# Patient Record
Sex: Female | Born: 1985
Health system: Southern US, Community
[De-identification: ages and names within clinical notes are randomized; demographics above are authoritative.]

## PROBLEM LIST (undated history)

## (undated) ENCOUNTER — Inpatient Hospital Stay (HOSPITAL_COMMUNITY): Payer: Self-pay

## (undated) DIAGNOSIS — N883 Incompetence of cervix uteri: Secondary | ICD-10-CM

## (undated) DIAGNOSIS — Z8489 Family history of other specified conditions: Secondary | ICD-10-CM

## (undated) DIAGNOSIS — F3281 Premenstrual dysphoric disorder: Secondary | ICD-10-CM

## (undated) DIAGNOSIS — E063 Autoimmune thyroiditis: Secondary | ICD-10-CM

## (undated) DIAGNOSIS — Z3490 Encounter for supervision of normal pregnancy, unspecified, unspecified trimester: Secondary | ICD-10-CM

## (undated) DIAGNOSIS — F419 Anxiety disorder, unspecified: Secondary | ICD-10-CM

## (undated) DIAGNOSIS — K219 Gastro-esophageal reflux disease without esophagitis: Secondary | ICD-10-CM

## (undated) DIAGNOSIS — R519 Headache, unspecified: Secondary | ICD-10-CM

## (undated) DIAGNOSIS — E282 Polycystic ovarian syndrome: Secondary | ICD-10-CM

## (undated) DIAGNOSIS — E039 Hypothyroidism, unspecified: Secondary | ICD-10-CM

## (undated) HISTORY — DX: Premenstrual dysphoric disorder: F32.81

## (undated) HISTORY — DX: Autoimmune thyroiditis: E06.3

## (undated) HISTORY — DX: Anxiety disorder, unspecified: F41.9

## (undated) HISTORY — DX: Headache, unspecified: R51.9

---
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
## 2004-02-03 ENCOUNTER — Ambulatory Visit (HOSPITAL_COMMUNITY): Admission: RE | Admit: 2004-02-03 | Discharge: 2004-02-03 | Payer: Self-pay | Admitting: Family Medicine

## 2004-02-04 ENCOUNTER — Emergency Department (HOSPITAL_COMMUNITY): Admission: EM | Admit: 2004-02-04 | Discharge: 2004-02-04 | Payer: Self-pay | Admitting: Emergency Medicine

## 2004-02-06 ENCOUNTER — Ambulatory Visit (HOSPITAL_COMMUNITY): Admission: RE | Admit: 2004-02-06 | Discharge: 2004-02-06 | Payer: Self-pay | Admitting: Family Medicine

## 2004-02-13 ENCOUNTER — Encounter: Admission: RE | Admit: 2004-02-13 | Discharge: 2004-02-13 | Payer: Self-pay | Admitting: Gastroenterology

## 2004-02-23 ENCOUNTER — Encounter: Admission: RE | Admit: 2004-02-23 | Discharge: 2004-02-23 | Payer: Self-pay | Admitting: Gastroenterology

## 2004-03-09 ENCOUNTER — Emergency Department (HOSPITAL_COMMUNITY): Admission: EM | Admit: 2004-03-09 | Discharge: 2004-03-09 | Payer: Self-pay | Admitting: Emergency Medicine

## 2004-03-16 ENCOUNTER — Emergency Department (HOSPITAL_COMMUNITY): Admission: EM | Admit: 2004-03-16 | Discharge: 2004-03-16 | Payer: Self-pay | Admitting: Emergency Medicine

## 2004-08-19 ENCOUNTER — Encounter: Admission: RE | Admit: 2004-08-19 | Discharge: 2004-11-17 | Payer: Self-pay | Admitting: Internal Medicine

## 2004-09-06 ENCOUNTER — Emergency Department (HOSPITAL_COMMUNITY): Admission: EM | Admit: 2004-09-06 | Discharge: 2004-09-06 | Payer: Self-pay | Admitting: Emergency Medicine

## 2004-10-25 ENCOUNTER — Ambulatory Visit (HOSPITAL_COMMUNITY): Admission: RE | Admit: 2004-10-25 | Discharge: 2004-10-25 | Payer: Self-pay | Admitting: Endocrinology

## 2005-03-28 ENCOUNTER — Encounter: Admission: RE | Admit: 2005-03-28 | Discharge: 2005-03-28 | Payer: Self-pay | Admitting: Gastroenterology

## 2005-06-08 ENCOUNTER — Emergency Department (HOSPITAL_COMMUNITY): Admission: EM | Admit: 2005-06-08 | Discharge: 2005-06-08 | Payer: Self-pay | Admitting: Emergency Medicine

## 2005-07-31 ENCOUNTER — Ambulatory Visit (HOSPITAL_COMMUNITY): Admission: RE | Admit: 2005-07-31 | Discharge: 2005-07-31 | Payer: Self-pay | Admitting: *Deleted

## 2006-02-17 IMAGING — CR DG BE W/ AIR HIGH DENSITY
8 of 10 series · 8 of 10 positions shown · non-contrast
Comparison: none

CLINICAL DATA: Diarrhea.

DOUBLE-CONTRAST BARIUM ENEMA

[view not recorded (1 of 8)]
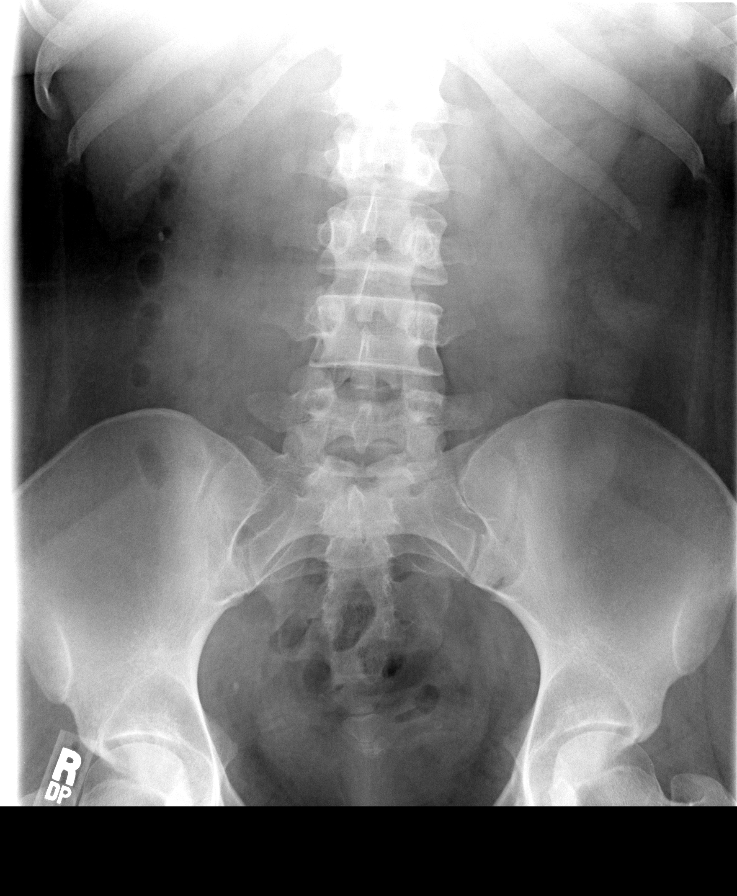

[view not recorded (2 of 8)]
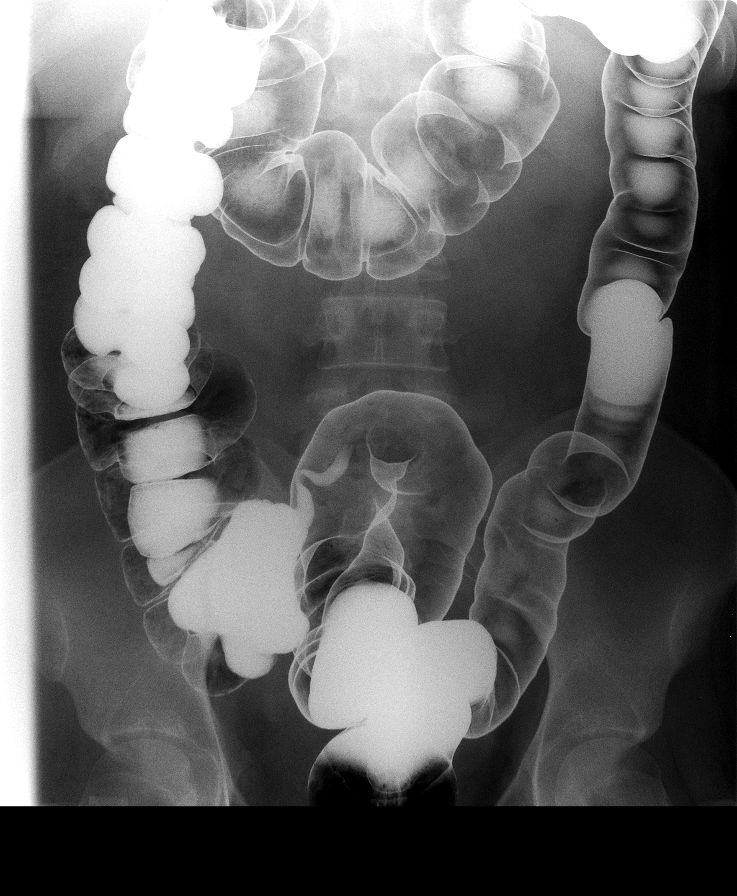

[view not recorded (3 of 8)]
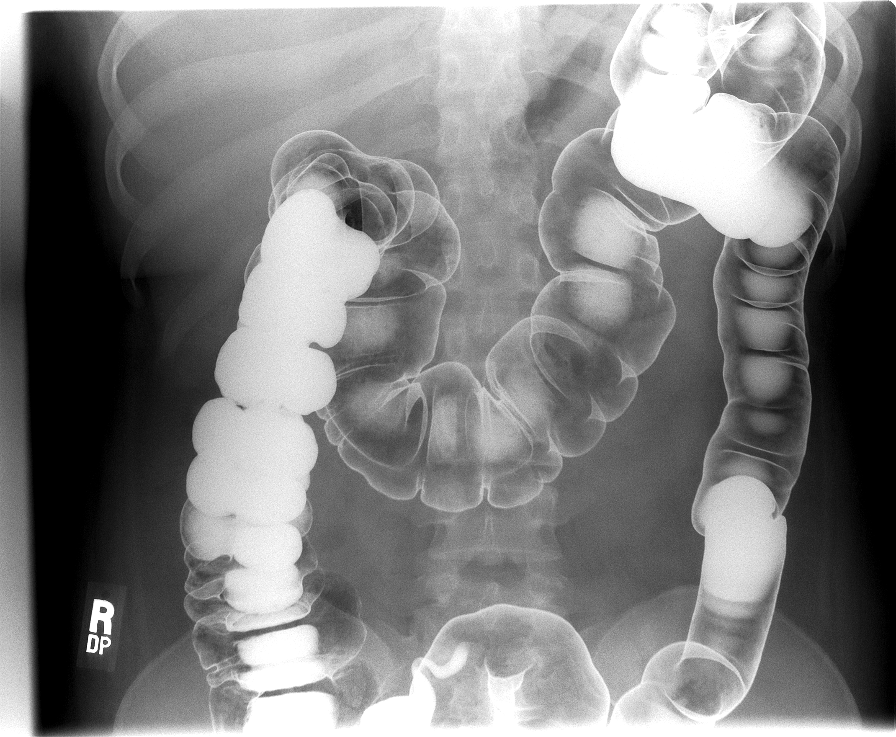

[view not recorded (4 of 8)]
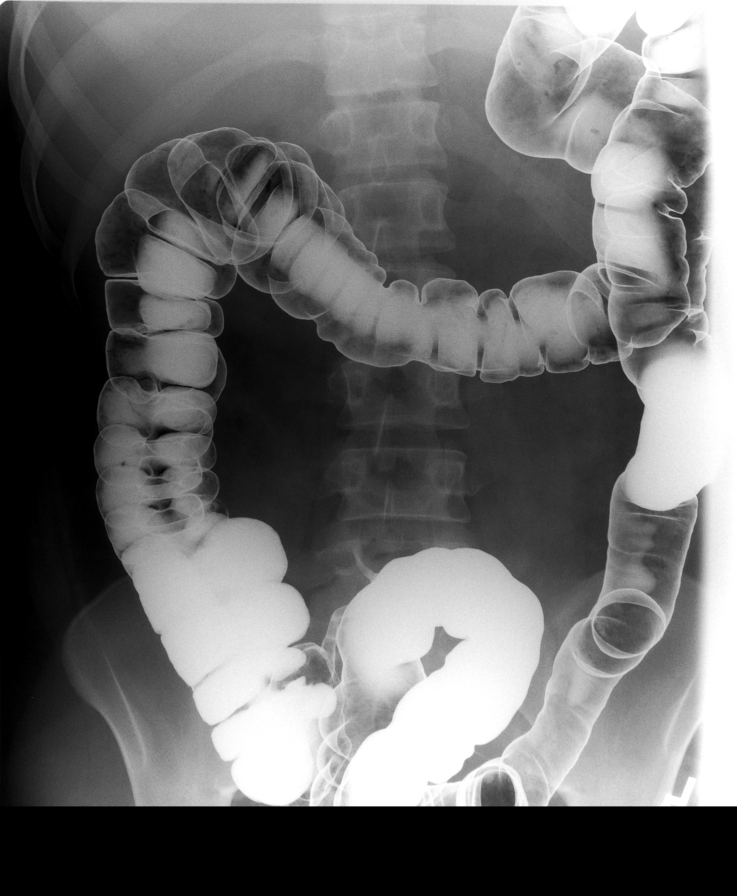

[view not recorded (5 of 8)]
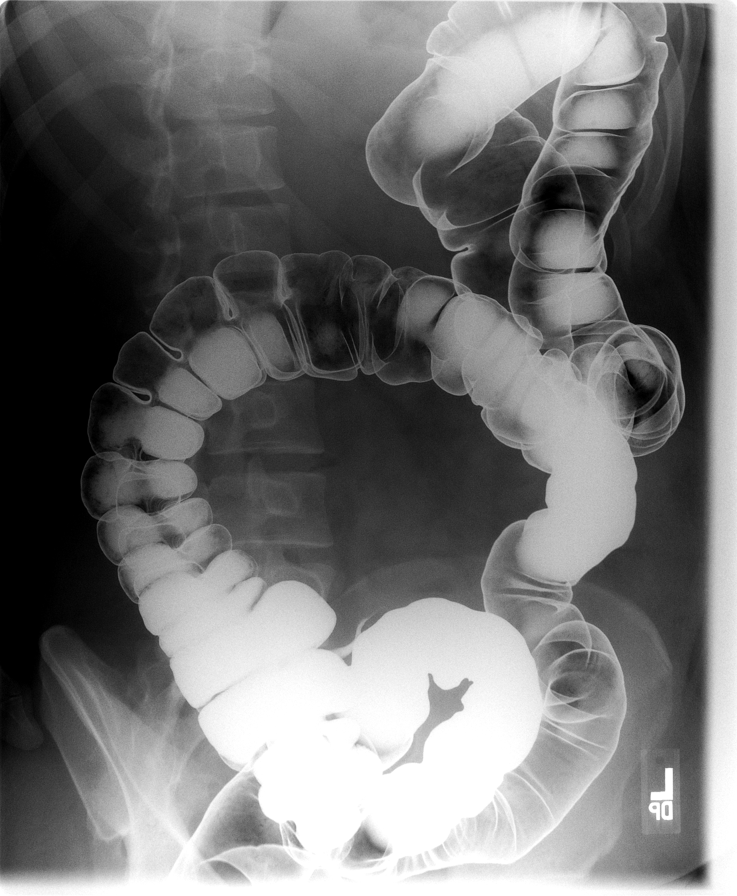

[view not recorded (6 of 8)]
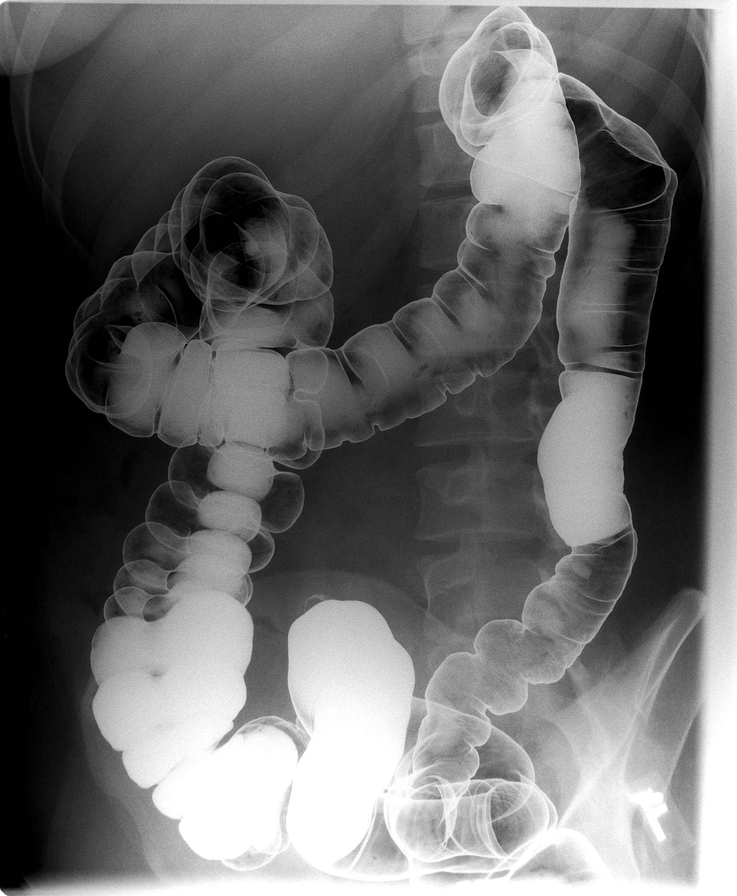

[view not recorded (7 of 8)]
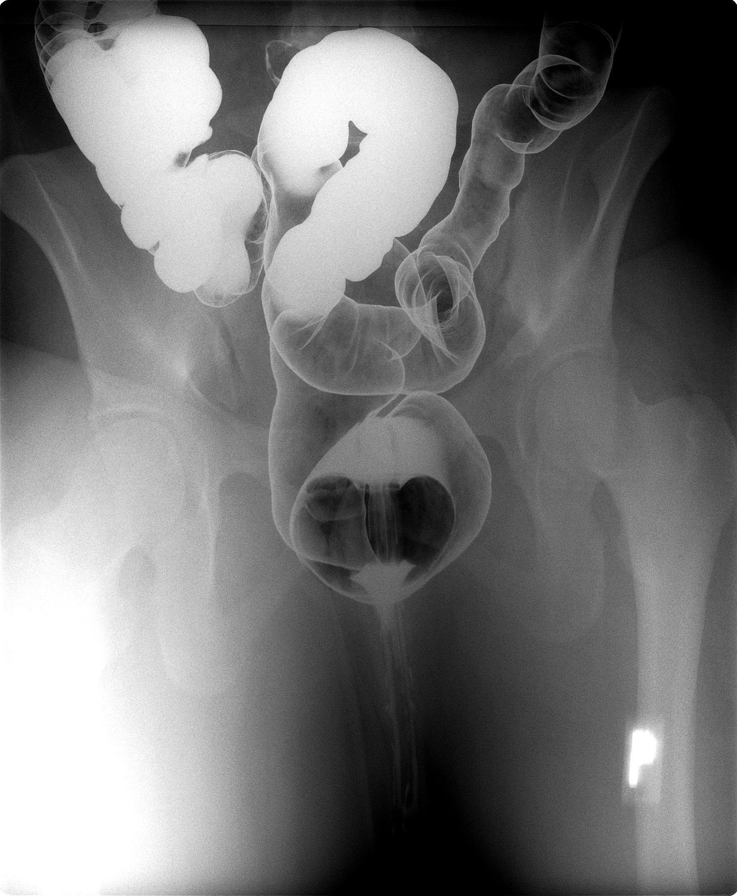

[view not recorded (8 of 8)]
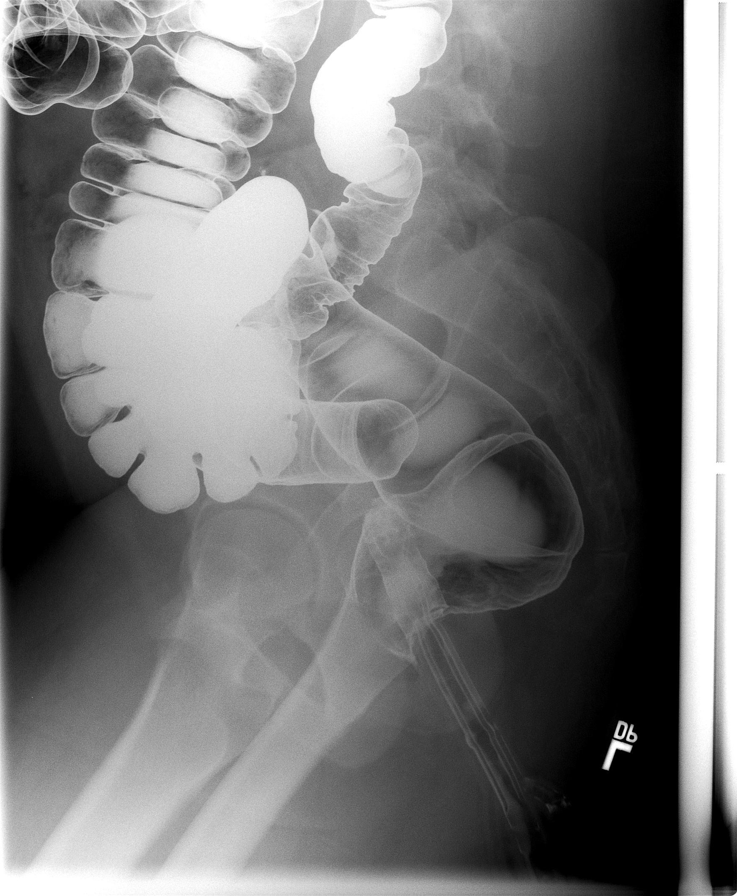

[8 of 10 positions shown; findings below may reference images not displayed]

FINDINGS: Double-contrast barium enema examination was performed with overhead and spot views of
the colon. No intrinsic or extrinsic mass lesions of the colon are identified. No mucosal
abnormalities are seen. The appendix fills and is normal.

IMPRESSION

1. Normal double-contrast barium enema.

## 2006-07-21 ENCOUNTER — Emergency Department (HOSPITAL_COMMUNITY): Admission: EM | Admit: 2006-07-21 | Discharge: 2006-07-21 | Payer: Self-pay | Admitting: Family Medicine

## 2007-03-17 ENCOUNTER — Encounter: Admission: RE | Admit: 2007-03-17 | Discharge: 2007-03-17 | Payer: Self-pay | Admitting: Otolaryngology

## 2007-08-25 ENCOUNTER — Emergency Department (HOSPITAL_COMMUNITY): Admission: EM | Admit: 2007-08-25 | Discharge: 2007-08-25 | Payer: Self-pay | Admitting: Emergency Medicine

## 2009-05-23 ENCOUNTER — Encounter: Admission: RE | Admit: 2009-05-23 | Discharge: 2009-05-30 | Payer: Self-pay | Admitting: Endocrinology

## 2009-11-25 ENCOUNTER — Emergency Department (HOSPITAL_COMMUNITY): Admission: EM | Admit: 2009-11-25 | Discharge: 2009-11-25 | Payer: Self-pay | Admitting: Family Medicine

## 2010-05-16 ENCOUNTER — Ambulatory Visit (HOSPITAL_COMMUNITY)
Admission: RE | Admit: 2010-05-16 | Discharge: 2010-05-16 | Payer: Self-pay | Source: Home / Self Care | Attending: Endocrinology | Admitting: Endocrinology

## 2010-06-23 ENCOUNTER — Encounter: Payer: Self-pay | Admitting: Family Medicine

## 2010-10-08 ENCOUNTER — Inpatient Hospital Stay (INDEPENDENT_AMBULATORY_CARE_PROVIDER_SITE_OTHER)
Admission: RE | Admit: 2010-10-08 | Discharge: 2010-10-08 | Disposition: A | Discharge status: Home / Self Care | Payer: BC Managed Care – PPO | Source: Ambulatory Visit | Attending: Emergency Medicine | Admitting: Emergency Medicine

## 2010-10-08 DIAGNOSIS — R05 Cough: Secondary | ICD-10-CM

## 2010-10-08 DIAGNOSIS — J40 Bronchitis, not specified as acute or chronic: Secondary | ICD-10-CM

## 2010-10-08 DIAGNOSIS — J3489 Other specified disorders of nose and nasal sinuses: Secondary | ICD-10-CM

## 2011-02-24 LAB — URINALYSIS, ROUTINE W REFLEX MICROSCOPIC
Bilirubin Urine: NEGATIVE
Glucose, UA: NEGATIVE
Ketones, ur: NEGATIVE
Nitrite: NEGATIVE
Protein, ur: NEGATIVE
Specific Gravity, Urine: 1.023
Urobilinogen, UA: 0.2
pH: 6.5

## 2011-02-24 LAB — POCT I-STAT, CHEM 8
BUN: 15
Calcium, Ion: 1.2
Chloride: 104
Creatinine, Ser: 1.1
Glucose, Bld: 124 — ABNORMAL HIGH
HCT: 41
Hemoglobin: 13.9
Potassium: 3.9
Sodium: 141
TCO2: 27

## 2011-02-24 LAB — CBC
HCT: 39.2
Hemoglobin: 13.6
MCHC: 34.7
MCV: 86.1
Platelets: 298
RBC: 4.56
RDW: 13.7
WBC: 13.4 — ABNORMAL HIGH

## 2011-02-24 LAB — DIFFERENTIAL
Basophils Absolute: 0
Basophils Relative: 0
Eosinophils Absolute: 0.1
Eosinophils Relative: 1
Lymphocytes Relative: 14
Lymphs Abs: 1.8
Monocytes Absolute: 0.7
Monocytes Relative: 6
Neutro Abs: 10.7 — ABNORMAL HIGH
Neutrophils Relative %: 80 — ABNORMAL HIGH

## 2011-02-24 LAB — URINE MICROSCOPIC-ADD ON

## 2011-02-24 LAB — POCT PREGNANCY, URINE
Operator id: 277751
Preg Test, Ur: NEGATIVE

## 2011-08-16 ENCOUNTER — Encounter (HOSPITAL_COMMUNITY): Payer: Self-pay | Admitting: *Deleted

## 2011-08-16 ENCOUNTER — Emergency Department (HOSPITAL_COMMUNITY)
Admission: EM | Admit: 2011-08-16 | Discharge: 2011-08-17 | Disposition: A | Discharge status: Home / Self Care | Payer: BC Managed Care – PPO | Attending: Emergency Medicine | Admitting: Emergency Medicine

## 2011-08-16 DIAGNOSIS — R112 Nausea with vomiting, unspecified: Secondary | ICD-10-CM | POA: Insufficient documentation

## 2011-08-16 DIAGNOSIS — E039 Hypothyroidism, unspecified: Secondary | ICD-10-CM | POA: Insufficient documentation

## 2011-08-16 DIAGNOSIS — R55 Syncope and collapse: Secondary | ICD-10-CM | POA: Insufficient documentation

## 2011-08-16 DIAGNOSIS — E282 Polycystic ovarian syndrome: Secondary | ICD-10-CM | POA: Insufficient documentation

## 2011-08-16 DIAGNOSIS — B349 Viral infection, unspecified: Secondary | ICD-10-CM

## 2011-08-16 DIAGNOSIS — R42 Dizziness and giddiness: Secondary | ICD-10-CM | POA: Insufficient documentation

## 2011-08-16 DIAGNOSIS — R509 Fever, unspecified: Secondary | ICD-10-CM | POA: Insufficient documentation

## 2011-08-16 DIAGNOSIS — B9789 Other viral agents as the cause of diseases classified elsewhere: Secondary | ICD-10-CM | POA: Insufficient documentation

## 2011-08-16 DIAGNOSIS — R197 Diarrhea, unspecified: Secondary | ICD-10-CM

## 2011-08-16 HISTORY — DX: Polycystic ovarian syndrome: E28.2

## 2011-08-16 HISTORY — DX: Hypothyroidism, unspecified: E03.9

## 2011-08-16 NOTE — ED Notes (Signed)
Pt reports generalized abd cramping and acute onset of diarrhea today, pt also w/ nausea no vomiting. Pt's family found pt in restroom today briefly unresponsive. Pt in no acute distress at present, A&Ox4.

## 2011-08-16 NOTE — ED Notes (Signed)
Per EMS - pt from home, pt has been experiencing onset of diarrhea today - pt w/ syncopal episode in restroom, found briefly unresponsive to family. Pt A&Ox4 at present, no acute distress. Pt also admits to some nausea.

## 2011-08-17 LAB — URINALYSIS, ROUTINE W REFLEX MICROSCOPIC
Glucose, UA: NEGATIVE mg/dL
Hgb urine dipstick: NEGATIVE
Ketones, ur: >80 mg/dL — AB
Leukocytes, UA: NEGATIVE
Nitrite: NEGATIVE
Protein, ur: 100 mg/dL — AB
Specific Gravity, Urine: 1.031 — ABNORMAL HIGH (ref 1.005–1.030)
Urobilinogen, UA: 0.2 mg/dL (ref 0.0–1.0)
pH: 6 (ref 5.0–8.0)

## 2011-08-17 LAB — BASIC METABOLIC PANEL
Calcium: 8.9 mg/dL (ref 8.4–10.5)
Chloride: 105 mEq/L (ref 96–112)
Creatinine, Ser: 0.61 mg/dL (ref 0.50–1.10)
GFR calc Af Amer: >90 mL/min (ref >90)
GFR calc non Af Amer: >90 mL/min (ref >90)

## 2011-08-17 LAB — DIFFERENTIAL
Basophils Absolute: 0 10*3/uL (ref 0.0–0.1)
Basophils Relative: 0 % (ref 0–1)
Monocytes Absolute: 0.4 10*3/uL (ref 0.1–1.0)
Neutro Abs: 12.5 10*3/uL — ABNORMAL HIGH (ref 1.7–7.7)

## 2011-08-17 LAB — URINE MICROSCOPIC-ADD ON

## 2011-08-17 LAB — CBC
HCT: 40.4 % (ref 36.0–46.0)
MCHC: 34.4 g/dL (ref 30.0–36.0)
RDW: 12.5 % (ref 11.5–15.5)

## 2011-08-17 MED ORDER — ONDANSETRON 8 MG PO TBDP: ORAL_TABLET | ORAL | Status: AC

## 2011-08-17 MED ORDER — ONDANSETRON HCL 4 MG/2ML IJ SOLN
4.0000 mg | Freq: Once | INTRAMUSCULAR | Status: AC
  Administered 2011-08-17: 4 mg via INTRAVENOUS
  Filled 2011-08-17: qty 2

## 2011-08-17 MED ORDER — SODIUM CHLORIDE 0.9 % IV BOLUS (SEPSIS)
1000.0000 mL | Freq: Once | INTRAVENOUS | Status: AC
  Administered 2011-08-17: 1000 mL via INTRAVENOUS

## 2011-08-17 MED ORDER — LOPERAMIDE HCL 2 MG PO CAPS
4.0000 mg | ORAL_CAPSULE | Freq: Once | ORAL | Status: AC
  Administered 2011-08-17: 4 mg via ORAL
  Filled 2011-08-17: qty 2

## 2011-08-17 NOTE — Discharge Instructions (Signed)
You were seen and evaluated today for your symptoms of nausea vomiting and diarrhea. Your lab tests today have not shown any concerning signs to explain your symptoms. At this time your providers feel your symptoms are caused from a viral infection. You were treated for dehydration with IV fluids and for your symptoms with medications to help with your nausea and diarrhea. Please continue to drink plenty of fluids at home and get plenty of rest. Please followup to primary care provider for continued evaluation and treatment. Return to emergency room for any worsening symptoms, chest pain, shortness of breath, dizziness or lightheadedness.  Nausea and Vomiting Nausea is a sick feeling that often comes before throwing up (vomiting). Vomiting is a reflex where stomach contents come out of your mouth. Vomiting can cause severe loss of body fluids (dehydration). Children and elderly adults can become dehydrated quickly, especially if they also have diarrhea. Nausea and vomiting are symptoms of a condition or disease. It is important to find the cause of your symptoms. CAUSES   Direct irritation of the stomach lining. This irritation can result from increased acid production (gastroesophageal reflux disease), infection, food poisoning, taking certain medicines (such as nonsteroidal anti-inflammatory drugs), alcohol use, or tobacco use.   Signals from the brain.These signals could be caused by a headache, heat exposure, an inner ear disturbance, increased pressure in the brain from injury, infection, a tumor, or a concussion, pain, emotional stimulus, or metabolic problems.   An obstruction in the gastrointestinal tract (bowel obstruction).   Illnesses such as diabetes, hepatitis, gallbladder problems, appendicitis, kidney problems, cancer, sepsis, atypical symptoms of a heart attack, or eating disorders.   Medical treatments such as chemotherapy and radiation.   Receiving medicine that makes you sleep  (general anesthetic) during surgery.  DIAGNOSIS Your caregiver may ask for tests to be done if the problems do not improve after a few days. Tests may also be done if symptoms are severe or if the reason for the nausea and vomiting is not clear. Tests may include:  Urine tests.   Blood tests.   Stool tests.   Cultures (to look for evidence of infection).   X-rays or other imaging studies.  Test results can help your caregiver make decisions about treatment or the need for additional tests. TREATMENT You need to stay well hydrated. Drink frequently but in small amounts.You may wish to drink water, sports drinks, clear broth, or eat frozen ice pops or gelatin dessert to help stay hydrated.When you eat, eating slowly may help prevent nausea.There are also some antinausea medicines that may help prevent nausea. HOME CARE INSTRUCTIONS   Take all medicine as directed by your caregiver.   If you do not have an appetite, do not force yourself to eat. However, you must continue to drink fluids.   If you have an appetite, eat a normal diet unless your caregiver tells you differently.   Eat a variety of complex carbohydrates (rice, wheat, potatoes, bread), lean meats, yogurt, fruits, and vegetables.   Avoid high-fat foods because they are more difficult to digest.   Drink enough water and fluids to keep your urine clear or pale yellow.   If you are dehydrated, ask your caregiver for specific rehydration instructions. Signs of dehydration may include:   Severe thirst.   Dry lips and mouth.   Dizziness.   Dark urine.   Decreasing urine frequency and amount.   Confusion.   Rapid breathing or pulse.  SEEK IMMEDIATE MEDICAL CARE IF:  You have blood or brown flecks (like coffee grounds) in your vomit.   You have black or bloody stools.   You have a severe headache or stiff neck.   You are confused.   You have severe abdominal pain.   You have chest pain or trouble  breathing.   You do not urinate at least once every 8 hours.   You develop cold or clammy skin.   You continue to vomit for longer than 24 to 48 hours.   You have a fever.  MAKE SURE YOU:   Understand these instructions.   Will watch your condition.   Will get help right away if you are not doing well or get worse.  Document Released: 05/19/2005 Document Revised: 05/08/2011 Document Reviewed: 10/16/2010 [facility] Patient Information 2012 [place], [place].  Viral Syndrome You or your child has Viral Syndrome. It is the most common infection causing "colds" and infections in the nose, throat, sinuses, and breathing tubes. Sometimes the infection causes nausea, vomiting, or diarrhea. The germ that causes the infection is a virus. No antibiotic or other medicine will kill it. There are medicines that you can take to make you or your child more comfortable.  HOME CARE INSTRUCTIONS   Rest in bed until you start to feel better.   If you have diarrhea or vomiting, eat small amounts of crackers and toast. Soup is helpful.   Do not give aspirin or medicine that contains aspirin to children.   Only take over-the-counter or prescription medicines for pain, discomfort, or fever as directed by your caregiver.  SEEK IMMEDIATE MEDICAL CARE IF:   You or your child has not improved within one week.   You or your child has pain that is not at least partially relieved by over-the-counter medicine.   Thick, colored mucus or blood is coughed up.   Discharge from the nose becomes thick yellow or green.   Diarrhea or vomiting gets worse.   There is any major change in your or your child's condition.   You or your child develops a skin rash, stiff neck, severe headache, or are unable to hold down food or fluid.   You or your child has an oral temperature above 102 F (38.9 C), not controlled by medicine.   Your baby is older than 3 months with a rectal temperature of 102 F (38.9 C) or higher.    Your baby is 46 months old or younger with a rectal temperature of 100.4 F (38 C) or higher.  Document Released: 05/04/2006 Document Revised: 05/08/2011 Document Reviewed: 05/05/2007 [facility] Patient Information 2012 [place], [place].

## 2011-08-17 NOTE — ED Notes (Signed)
Rx given x1 D/c instructions reviewed w/ pt and family - pt and family deny any further questions or concerns at present.  

## 2011-08-17 NOTE — ED Provider Notes (Signed)
History     CSN: [id number]  Arrival date & time 08/16/11  2344   First MD Initiated Contact with Patient 08/17/11 0032      Chief Complaint  Patient presents with  . Diarrhea  . Loss of Consciousness     HPI  History provided by the patient. Patient is a healthy 26 year old female with no significant past medical history who presents with complaints of acute onset nausea and diarrhea around 1 PM today. Patient reports multiple episodes of diarrhea episodes. Symptoms are described as moderate to severe. Patient denies any aggravating or alleviating factors. She reports having associated fever, chills and sweats. Patient also reports feeling very lightheaded and faint. She reports having a near-syncopal episode after standing up too quickly earlier in the day. Patient reports having difficulty drinking fluids. She has not taken anything for her symptoms. 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 Patient has no other significant past medical history.    Past Medical History  Diagnosis Date  . Polycystic ovary syndrome   . Hypothyroidism     Past Surgical History  Procedure Date  . Ankle surgery     History reviewed. No pertinent family history.  History  Substance Use Topics  . Smoking status: Never Smoker   . Smokeless tobacco: Not on file  . Alcohol Use: No    OB History    Grav Para Term Preterm Abortions TAB SAB Ect Mult Living                  Review of Systems  Constitutional: Negative for fever and chills.  All other systems reviewed and are negative.    Allergies  Amoxicillin  Home Medications   Current Outpatient Rx  Name Route Sig Dispense Refill  . LEVOTHYROXINE SODIUM 137 MCG PO CAPS Oral Take 1 capsule by mouth See admin instructions. Takes Monday-Friday    . LEVOTHYROXINE SODIUM 150 MCG PO CAPS Oral Take 1 capsule by mouth 2 (two) times a week. Takes on Saturday and Sunday      BP 112/64  Pulse 94  Temp(Src) 98.6 F (37  C) (Oral)  Resp 18  SpO2 97%  LMP 07/15/2011  Physical Exam  Nursing note and vitals reviewed. Constitutional: She is oriented to person, place, and time. She appears well-developed and well-nourished. No distress.  HENT:  Head: Normocephalic and atraumatic.  Eyes: Conjunctivae and EOM are normal. Pupils are equal, round, and reactive to light.  Neck: Normal range of motion. Neck supple.       No meningeal sign  Cardiovascular: Normal rate and regular rhythm.   Pulmonary/Chest: Effort normal and breath sounds normal. No respiratory distress. She has no wheezes. She has no rales.  Abdominal: Soft. Bowel sounds are normal. She exhibits no distension. There is no rebound and no guarding.       Mild diffuse tenderness  Musculoskeletal: She exhibits no edema and no tenderness.  Neurological: She is alert and oriented to person, place, and time. She has normal strength. No cranial nerve deficit or sensory deficit. Gait normal.  Skin: Skin is warm and dry. No rash noted.  Psychiatric: She has a normal mood and affect. Her behavior is normal.    ED Course  Procedures   Results for orders placed during the hospital encounter of 08/16/11  URINALYSIS, ROUTINE W REFLEX MICROSCOPIC      Component Value Range   Color, Urine AMBER (*) YELLOW    APPearance CLOUDY (*) CLEAR  Specific Gravity, Urine 1.031 (*) 1.005 - 1.030    pH 6.0  5.0 - 8.0    Glucose, UA NEGATIVE  NEGATIVE (mg/dL)   Hgb urine dipstick NEGATIVE  NEGATIVE    Bilirubin Urine SMALL (*) NEGATIVE    Ketones, ur >80 (*) NEGATIVE (mg/dL)   Protein, ur 098 (*) NEGATIVE (mg/dL)   Urobilinogen, UA 0.2  0.0 - 1.0 (mg/dL)   Nitrite NEGATIVE  NEGATIVE    Leukocytes, UA NEGATIVE  NEGATIVE   POCT PREGNANCY, URINE      Component Value Range   Preg Test, Ur NEGATIVE  NEGATIVE   URINE MICROSCOPIC-ADD ON      Component Value Range   Squamous Epithelial / LPF MANY (*) RARE    WBC, UA 3-6  <3 (WBC/hpf)   RBC / HPF 0-2  <3 (RBC/hpf)    Bacteria, UA MANY (*) RARE    Urine-Other MUCOUS PRESENT    CBC      Component Value Range   WBC 13.4 (*) 4.0 - 10.5 (K/uL)   RBC 4.70  3.87 - 5.11 (MIL/uL)   Hemoglobin 13.9  12.0 - 15.0 (g/dL)   HCT 11.9  14.7 - 82.9 (%)   MCV 86.0  78.0 - 100.0 (fL)   MCH 29.6  26.0 - 34.0 (pg)   MCHC 34.4  30.0 - 36.0 (g/dL)   RDW 56.2  13.0 - 86.5 (%)   Platelets 251  150 - 400 (K/uL)  DIFFERENTIAL      Component Value Range   Neutrophils Relative 93 (*) 43 - 77 (%)   Neutro Abs 12.5 (*) 1.7 - 7.7 (K/uL)   Lymphocytes Relative 3 (*) 12 - 46 (%)   Lymphs Abs 0.5 (*) 0.7 - 4.0 (K/uL)   Monocytes Relative 3  3 - 12 (%)   Monocytes Absolute 0.4  0.1 - 1.0 (K/uL)   Eosinophils Relative 0  0 - 5 (%)   Eosinophils Absolute 0.0  0.0 - 0.7 (K/uL)   Basophils Relative 0  0 - 1 (%)   Basophils Absolute 0.0  0.0 - 0.1 (K/uL)      1. Nausea vomiting and diarrhea   2. Viral syndrome       MDM  Patient seen and evaluated. Patient no acute distress. 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   Angus Seller, [place] 08/18/11 [phone]

## 2011-08-18 NOTE — ED Provider Notes (Signed)
Medical screening examination/treatment/procedure(s) were performed by non-physician practitioner and as supervising physician I was immediately available for consultation/collaboration.  Jasmine Awe, MD 08/18/11 [phone]

## 2014-03-17 LAB — OB RESULTS CONSOLE ABO/RH: RH Type: POSITIVE

## 2014-03-17 LAB — OB RESULTS CONSOLE HEPATITIS B SURFACE ANTIGEN: HEP B S AG: NEGATIVE

## 2014-03-17 LAB — OB RESULTS CONSOLE RUBELLA ANTIBODY, IGM: RUBELLA: IMMUNE

## 2014-03-17 LAB — OB RESULTS CONSOLE HIV ANTIBODY (ROUTINE TESTING): HIV: NONREACTIVE

## 2014-03-17 LAB — OB RESULTS CONSOLE RPR: RPR: NONREACTIVE

## 2014-03-30 LAB — OB RESULTS CONSOLE GC/CHLAMYDIA
Chlamydia: NEGATIVE
GC PROBE AMP, GENITAL: NEGATIVE

## 2014-06-02 NOTE — L&D Delivery Note (Signed)
Delivery Note At 5:43 PM a healthy female was delivered via Vaginal, Spontaneous Delivery (Presentation: Vertex ; Occiput Anterior). 2 loose [place]. APGAR: 8, 9; weight  7 Lbs 15 Oz.   Placenta status: Intact, Spontaneous.  Cord: 3 vessels with the following complications: None.  Cord pH: N/A  Anesthesia: Epidural  Episiotomy: None Lacerations: 2nd degree;Perineal Suture Repair: 2.0 3.0 vicryl Est. Blood Loss (mL):  350  Mom to postpartum.  Baby to Couplet care / Skin to Skin.  Deepak Bless,MARIE-LYNE 10/19/2014, 6:37 PM

## 2014-07-24 ENCOUNTER — Inpatient Hospital Stay (HOSPITAL_COMMUNITY)
Admission: AD | Admit: 2014-07-24 | Discharge: 2014-08-31 | DRG: 781 | Disposition: A | Discharge status: Home / Self Care | Payer: 59 | Source: Ambulatory Visit | Attending: Obstetrics and Gynecology | Admitting: Obstetrics and Gynecology

## 2014-07-24 ENCOUNTER — Inpatient Hospital Stay (HOSPITAL_COMMUNITY): Payer: 59

## 2014-07-24 ENCOUNTER — Encounter (HOSPITAL_COMMUNITY): Payer: Self-pay | Admitting: *Deleted

## 2014-07-24 ENCOUNTER — Encounter (HOSPITAL_COMMUNITY): Payer: Self-pay | Admitting: Anesthesiology

## 2014-07-24 DIAGNOSIS — N3289 Other specified disorders of bladder: Secondary | ICD-10-CM | POA: Diagnosis not present

## 2014-07-24 DIAGNOSIS — Z3A28 28 weeks gestation of pregnancy: Secondary | ICD-10-CM | POA: Insufficient documentation

## 2014-07-24 DIAGNOSIS — O9989 Other specified diseases and conditions complicating pregnancy, childbirth and the puerperium: Secondary | ICD-10-CM | POA: Diagnosis not present

## 2014-07-24 DIAGNOSIS — R339 Retention of urine, unspecified: Secondary | ICD-10-CM | POA: Diagnosis not present

## 2014-07-24 DIAGNOSIS — K5900 Constipation, unspecified: Secondary | ICD-10-CM | POA: Diagnosis not present

## 2014-07-24 DIAGNOSIS — O321XX0 Maternal care for breech presentation, not applicable or unspecified: Secondary | ICD-10-CM | POA: Diagnosis present

## 2014-07-24 DIAGNOSIS — O99820 Streptococcus B carrier state complicating pregnancy: Secondary | ICD-10-CM | POA: Diagnosis present

## 2014-07-24 DIAGNOSIS — O99612 Diseases of the digestive system complicating pregnancy, second trimester: Secondary | ICD-10-CM | POA: Diagnosis not present

## 2014-07-24 DIAGNOSIS — E282 Polycystic ovarian syndrome: Secondary | ICD-10-CM | POA: Diagnosis present

## 2014-07-24 DIAGNOSIS — E039 Hypothyroidism, unspecified: Secondary | ICD-10-CM | POA: Diagnosis present

## 2014-07-24 DIAGNOSIS — Z79899 Other long term (current) drug therapy: Secondary | ICD-10-CM | POA: Diagnosis not present

## 2014-07-24 DIAGNOSIS — R51 Headache: Secondary | ICD-10-CM | POA: Diagnosis not present

## 2014-07-24 DIAGNOSIS — Z23 Encounter for immunization: Secondary | ICD-10-CM | POA: Diagnosis not present

## 2014-07-24 DIAGNOSIS — O99283 Endocrine, nutritional and metabolic diseases complicating pregnancy, third trimester: Secondary | ICD-10-CM

## 2014-07-24 DIAGNOSIS — Z3A26 26 weeks gestation of pregnancy: Secondary | ICD-10-CM | POA: Diagnosis present

## 2014-07-24 DIAGNOSIS — Z3A31 31 weeks gestation of pregnancy: Secondary | ICD-10-CM | POA: Insufficient documentation

## 2014-07-24 DIAGNOSIS — N883 Incompetence of cervix uteri: Secondary | ICD-10-CM

## 2014-07-24 DIAGNOSIS — O3433 Maternal care for cervical incompetence, third trimester: Secondary | ICD-10-CM

## 2014-07-24 DIAGNOSIS — O3432 Maternal care for cervical incompetence, second trimester: Secondary | ICD-10-CM | POA: Diagnosis present

## 2014-07-24 DIAGNOSIS — O99282 Endocrine, nutritional and metabolic diseases complicating pregnancy, second trimester: Secondary | ICD-10-CM | POA: Diagnosis present

## 2014-07-24 DIAGNOSIS — Z3689 Encounter for other specified antenatal screening: Secondary | ICD-10-CM | POA: Insufficient documentation

## 2014-07-24 DIAGNOSIS — O3482 Maternal care for other abnormalities of pelvic organs, second trimester: Secondary | ICD-10-CM | POA: Diagnosis present

## 2014-07-24 LAB — TYPE AND SCREEN
ABO/RH(D): O POS
Antibody Screen: NEGATIVE

## 2014-07-24 LAB — CBC
HCT: 32.4 % — ABNORMAL LOW (ref 36.0–46.0)
HEMOGLOBIN: 11.4 g/dL — AB (ref 12.0–15.0)
MCH: 32.9 pg (ref 26.0–34.0)
MCHC: 35.2 g/dL (ref 30.0–36.0)
MCV: 93.6 fL (ref 78.0–100.0)
Platelets: 183 10*3/uL (ref 150–400)
RBC: 3.46 MIL/uL — AB (ref 3.87–5.11)
RDW: 12.7 % (ref 11.5–15.5)
WBC: 10 10*3/uL (ref 4.0–10.5)

## 2014-07-24 LAB — GROUP B STREP BY PCR: Group B strep by PCR: POSITIVE — AB

## 2014-07-24 LAB — OB RESULTS CONSOLE GBS: GBS: POSITIVE

## 2014-07-24 LAB — ABO/RH: ABO/RH(D): O POS

## 2014-07-24 MED ORDER — MAGNESIUM SULFATE 40 G IN LACTATED RINGERS - SIMPLE
2.0000 g/h | INTRAVENOUS | Status: DC
  Administered 2014-07-25: 2 g/h via INTRAVENOUS
  Filled 2014-07-24 (×3): qty 500

## 2014-07-24 MED ORDER — MAGNESIUM SULFATE 40 G IN LACTATED RINGERS - SIMPLE
2.0000 g/h | INTRAVENOUS | Status: DC
  Administered 2014-07-24: 2 g/h via INTRAVENOUS
  Filled 2014-07-24: qty 500

## 2014-07-24 MED ORDER — BETAMETHASONE SOD PHOS & ACET 6 (3-3) MG/ML IJ SUSP
12.0000 mg | Freq: Once | INTRAMUSCULAR | Status: AC
  Administered 2014-07-25: 12 mg via INTRAMUSCULAR
  Filled 2014-07-24: qty 2

## 2014-07-24 MED ORDER — ACETAMINOPHEN 325 MG PO TABS
650.0000 mg | ORAL_TABLET | ORAL | Status: DC | PRN
  Administered 2014-08-10 – 2014-08-28 (×3): 650 mg via ORAL
  Filled 2014-07-24 (×3): qty 2

## 2014-07-24 MED ORDER — ZOLPIDEM TARTRATE 5 MG PO TABS
5.0000 mg | ORAL_TABLET | Freq: Every evening | ORAL | Status: DC | PRN
  Administered 2014-07-25: 5 mg via ORAL
  Filled 2014-07-24 (×2): qty 1

## 2014-07-24 MED ORDER — DEXTROSE IN LACTATED RINGERS 5 % IV SOLN: INTRAVENOUS | Status: DC

## 2014-07-24 MED ORDER — MAGNESIUM SULFATE 40 G IN LACTATED RINGERS - SIMPLE
2.0000 g/h | Freq: Once | INTRAVENOUS | Status: DC
  Filled 2014-07-24: qty 500

## 2014-07-24 MED ORDER — CALCIUM CARBONATE ANTACID 500 MG PO CHEW
2.0000 | CHEWABLE_TABLET | ORAL | Status: DC | PRN
  Administered 2014-07-26 – 2014-07-27 (×3): 400 mg via ORAL
  Filled 2014-07-24 (×4): qty 1

## 2014-07-24 MED ORDER — DOCUSATE SODIUM 100 MG PO CAPS
100.0000 mg | ORAL_CAPSULE | Freq: Every day | ORAL | Status: DC
  Administered 2014-07-25 – 2014-08-31 (×38): 100 mg via ORAL
  Filled 2014-07-24 (×38): qty 1

## 2014-07-24 MED ORDER — MAGNESIUM SULFATE BOLUS VIA INFUSION
4.0000 g | Freq: Once | INTRAVENOUS | Status: AC
  Administered 2014-07-24: 4 g via INTRAVENOUS
  Filled 2014-07-24: qty 500

## 2014-07-24 MED ORDER — BETAMETHASONE SOD PHOS & ACET 6 (3-3) MG/ML IJ SUSP
12.0000 mg | Freq: Once | INTRAMUSCULAR | Status: AC
  Administered 2014-07-24: 12 mg via INTRAMUSCULAR
  Filled 2014-07-24: qty 2

## 2014-07-24 MED ORDER — LIDOCAINE HCL 2 % EX GEL
1.0000 "application " | CUTANEOUS | Status: DC | PRN
  Administered 2014-07-24: 1 via URETHRAL
  Filled 2014-07-24 (×2): qty 5

## 2014-07-24 MED ORDER — AZITHROMYCIN 1 G PO PACK
1.0000 g | PACK | Freq: Once | ORAL | Status: AC
  Administered 2014-07-24: 1 g via ORAL
  Filled 2014-07-24: qty 1

## 2014-07-24 MED ORDER — LEVOTHYROXINE SODIUM 112 MCG PO TABS
112.0000 ug | ORAL_TABLET | Freq: Every day | ORAL | Status: DC
  Administered 2014-07-25 – 2014-08-31 (×38): 112 ug via ORAL
  Filled 2014-07-24 (×39): qty 1

## 2014-07-24 MED ORDER — MAGNESIUM SULFATE 40 G IN LACTATED RINGERS - SIMPLE
2.0000 g/h | INTRAVENOUS | Status: DC
  Filled 2014-07-24: qty 500

## 2014-07-24 MED ORDER — CEFAZOLIN SODIUM 1-5 GM-% IV SOLN
1.0000 g | Freq: Three times a day (TID) | INTRAVENOUS | Status: AC
  Administered 2014-07-24 – 2014-07-26 (×7): 1 g via INTRAVENOUS
  Filled 2014-07-24 (×9): qty 50

## 2014-07-24 MED ORDER — PRENATAL MULTIVITAMIN CH
1.0000 | ORAL_TABLET | Freq: Every day | ORAL | Status: DC
  Administered 2014-07-25: 1 via ORAL
  Filled 2014-07-24: qty 1

## 2014-07-24 MED ORDER — MAGNESIUM SULFATE BOLUS VIA INFUSION
4.0000 g | Freq: Once | INTRAVENOUS | Status: DC
  Filled 2014-07-24: qty 500

## 2014-07-24 MED ORDER — HYDROXYPROGESTERONE CAPROATE 250 MG/ML IM OIL
250.0000 mg | TOPICAL_OIL | INTRAMUSCULAR | Status: DC
  Administered 2014-07-24 – 2014-08-28 (×6): 250 mg via INTRAMUSCULAR
  Filled 2014-07-24 (×6): qty 1

## 2014-07-24 MED ORDER — LACTATED RINGERS IV SOLN
INTRAVENOUS | Status: DC
  Administered 2014-07-24 (×2): via INTRAVENOUS
  Administered 2014-07-25: 50 mL/h via INTRAVENOUS

## 2014-07-24 NOTE — H&P (Addendum)
Teresa Bell is a 29 y.o. female G1P0 MWF @ 26 4/[redacted] weeks gestation sent from office due to [place] noted on exam. Pt presented with c/o vaginal spotting. Hx PCOS. No cervical surgeries. Had transient episode of cervical shortening by sono in office that subsequently resolved.   Maternal Medical History:  Prenatal complications: no prenatal complications   OB History    Gravida Para Term Preterm AB TAB SAB Ectopic Multiple Living   1              Past Medical History  Diagnosis Date  . Polycystic ovary syndrome   . Hypothyroidism    Past Surgical History  Procedure Laterality Date  . Ankle surgery     Family History: family history is not on file. Social History:  reports that she has never smoked. She does not have any smokeless tobacco history on file. She reports that she does not drink alcohol or use illicit drugs.   Prenatal Transfer Tool  Maternal Diabetes: No( not done as yet) Genetic Screening: Normal Maternal Ultrasounds/Referrals: Normal Fetal Ultrasounds or other Referrals:  None Maternal Substance Abuse:  No Significant Maternal Medications:  None Significant Maternal Lab Results:  None Other Comments:  hx PCOS  ROS neg  Dilation: 4 Effacement (%): 80 Exam by:: Rekia Kujala Blood pressure 126/81, pulse 65, last menstrual period 01/19/2014, SpO2 100 %. Maternal Exam:  Abdomen: Patient reports no abdominal tenderness. Fetal presentation: breech  Introitus: Normal vulva. Normal vagina.    Physical Exam  Constitutional: She is oriented to person, place, and time. She appears well-developed and well-nourished.  HENT:  Head: Atraumatic.  Eyes: EOM are normal.  Neck: Neck supple.  Cardiovascular: Regular rhythm.   Respiratory: Effort normal.  GI: Soft.  Musculoskeletal: She exhibits no edema.  Neurological: She is alert and oriented to person, place, and time.  Skin: Skin is warm and dry.  Psychiatric: She has a normal mood and affect.   VE: clear membrane(  amniotic sac noted) 4/80/ breech Prenatal labs: ABO, Rh: --/--/O POS (02/22 1125) Antibody: PENDING (02/22 1125) Rubella:  Immune RPR:   NR HBsAg:   negative HIV:   NR GBS:  pending  CBC    Component Value Date/Time   WBC 10.0 07/24/2014 1125   RBC 3.46* 07/24/2014 1125   HGB 11.4* 07/24/2014 1125   HCT 32.4* 07/24/2014 1125   PLT 183 07/24/2014 1125   MCV 93.6 07/24/2014 1125   MCH 32.9 07/24/2014 1125   MCHC 35.2 07/24/2014 1125   RDW 12.7 07/24/2014 1125   LYMPHSABS 0.5* 08/17/2011 0245   MONOABS 0.4 08/17/2011 0245   EOSABS 0.0 08/17/2011 0245   BASOSABS 0.0 08/17/2011 0245   GBS cx done  Assessment/Plan: Cervical incompetence Preterm cervical dilation/PTL Frank breech IUP @ 26 4/7 weeks P) admit. Routine labs. NICU consult.  Magnesium for seizure prophylaxis. BMZ x2, GBS cx done. Sonogram for presentation and EFW. Trendelenberg( bag balloons as you watch with spec). foley   Teresa Bell A 07/24/2014, 12:57 PM  Sono: frank breech. 1lb 14 oz Spoke with Dr Tamala Julian( neonatologist). To see pt

## 2014-07-24 NOTE — Progress Notes (Signed)
Patient ID: Teresa Bell, female   DOB: [birth year], 29 y.o.   MRN: [id number] Given bulging membranes and unknown GBS status, will start IV Ancef until GBS resulted and add 1 gm zithromax PO.

## 2014-07-24 NOTE — Progress Notes (Signed)
Pt voided on bedpan.  Pt still feeling pressure.  Discussed with Dr Ronita Hipps.  Will remove current catheter and replace

## 2014-07-24 NOTE — Anesthesia Preprocedure Evaluation (Deleted)
Anesthesia Evaluation  Patient identified by MRN, date of birth, ID band Patient awake    Reviewed: Allergy & Precautions, NPO status , Patient's Chart, lab work & pertinent test results  History of Anesthesia Complications Negative for: history of anesthetic complications  Airway Mallampati: II  TM Distance: >3 FB Neck ROM: Full    Dental no notable dental hx. (+) Dental Advisory Given   Pulmonary neg pulmonary ROS,  breath sounds clear to auscultation  Pulmonary exam normal       Cardiovascular negative cardio ROS  Rhythm:Regular Rate:Normal     Neuro/Psych negative neurological ROS  negative psych ROS   GI/Hepatic negative GI ROS, Neg liver ROS,   Endo/Other  negative endocrine ROSHypothyroidism obesity  Renal/GU negative Renal ROS  negative genitourinary   Musculoskeletal negative musculoskeletal ROS (+)   Abdominal   Peds negative pediatric ROS (+)  Hematology negative hematology ROS (+)   Anesthesia Other Findings   Reproductive/Obstetrics (+) Pregnancy 26 weeks                             Anesthesia Physical Anesthesia Plan  ASA: II  Anesthesia Plan:    Post-op Pain Management:    Induction:   Airway Management Planned:   Additional Equipment:   Intra-op Plan:   Post-operative Plan:   Informed Consent:   Plan Discussed with:   Anesthesia Plan Comments:         Anesthesia Quick Evaluation

## 2014-07-24 NOTE — MAU Note (Signed)
Pt sent from MD office for direct admit but will be in MAU @ this time.  26 weeks with bulging membrane.

## 2014-07-24 NOTE — Plan of Care (Signed)
Problem: Consults Goal: Birthing Suites Patient Information Press F2 to bring up selections list  Pt < [redacted] weeks EGA Goal: Neonatologist Consult Outcome: Progressing NICU notified for consult

## 2014-07-24 NOTE — Progress Notes (Signed)
CRITICAL VALUE ALERT  Critical value received:  + GBS  Date of notification:  07/24/2014  Time of notification:  4709  Critical value read back:Yes.    Nurse who received alert:  Stefanie Libel RN    MD notified (1st page):  Dr Ronita Hipps    Time of first page:  1438  MD notified (2nd page):  Time of second page:  Responding MD:  Dr Ronita Hipps  Time MD responded:  1440

## 2014-07-25 LAB — URINE CULTURE
CULTURE: NO GROWTH
Colony Count: NO GROWTH

## 2014-07-25 LAB — RPR: RPR: NONREACTIVE

## 2014-07-25 MED ORDER — COMPLETENATE 29-1 MG PO CHEW
1.0000 | CHEWABLE_TABLET | Freq: Every day | ORAL | Status: DC
  Administered 2014-07-26 – 2014-08-30 (×34): 1 via ORAL
  Filled 2014-07-25 (×37): qty 1

## 2014-07-25 MED ORDER — PHENAZOPYRIDINE HCL 200 MG PO TABS
200.0000 mg | ORAL_TABLET | Freq: Three times a day (TID) | ORAL | Status: DC | PRN
  Filled 2014-07-25: qty 1

## 2014-07-25 MED ORDER — PHENAZOPYRIDINE HCL 200 MG PO TABS
200.0000 mg | ORAL_TABLET | Freq: Three times a day (TID) | ORAL | Status: DC
  Administered 2014-07-25 (×2): 200 mg via ORAL
  Filled 2014-07-25 (×6): qty 1

## 2014-07-25 NOTE — Progress Notes (Signed)
Pt did have bladder spasms after standing on bedside scale

## 2014-07-25 NOTE — Progress Notes (Signed)
HD #1 70 w 5d Cervical insufficiency vs PTL  S: Pt continues to complain about leakage outside foley. ? Bladder spasms. NO bleeding. No LOF. Good FM. Denies contractions. O BP 114/48 mmHg  Pulse 77  Temp(Src) 98 F (36.7 C) (Oral)  Resp 16  Ht 5\' 7"  (1.702 m)  Wt 87.998 kg (194 lb)  BMI 30.38 kg/m2  SpO2 100%  LMP 01/19/2014  HEENT: nl Neck: supple with FROM Lungs:CTA Cv:RRR Abd: gravid , non tender No cvat Ext: neg c/c/e Pelvic : deferred Neuro: non focal  Skin : intact  FHR reassuring No contractions noted GBS positive UCS neg Sono c/w aGA, breech, nl AFI  IMP: 26+ weeks Likely cervical insufficiency , doubt PTL Malpresentation GBS positive Bladder spasms  P: Finish MgSO4 neuroprotection today BMZ today Ancef x 48hrs Transfer to antenatal today Will dc foley if Ambien and Pyridium not effective

## 2014-07-25 NOTE — Progress Notes (Signed)
Pt complains of heaviness in chest--pulse ox placed on pt o2 at 100% pulse 87--lungs clear to ausculation--encouraged pt to lay on her side--pt states symptoms subsides when not flat on her back

## 2014-07-25 NOTE — Consult Note (Signed)
Asked by Dr Garwin Brothers to speak to Mrs Civil to discuss outcome of preterm pregnancy at 26 weeks. Chart reviewed. She is 26 5/7 weeks, 3 cm dilated, with intact membranes protruding in the cervix. Other complications during pregnancy include: positive GBS, hypothyroidism, and frank breech presentaion. She has received 2 doses of betamethasone and magnesium for neuroprophylaxis. Currently on the following meds: Ancef, OH progesterone, and Synthroid.  I spome to Mrs Asebedo and her husband in her room. I discussed good survival rate at this gestation and common morbidities such as RDS with need for resp support and  GI and neurologic immaturity. Discussed need for temp support, HAL, umbilical lines, gavage feeding, and LOS. I discussed decreasing morbidity and decreased LOS with increasing gest. I also discussed breast feeding and benefits especially to a preterm baby.   I answered her questions to her satisfaction.   I spent 30 minutes with this consult, more than 50% of the time was with face-to-face counseling with Mrs Deblasi and her husband.   Tommie Sams, MD  Neonatologist

## 2014-07-25 NOTE — Progress Notes (Signed)
Hospital day # 1 G1 pregnancy at [redacted]w[redacted]d.  Cervical incompetence.  BMethazone x 2 received.  MgSO4 12 hrs for Neuroprotection completed.  GBS pos on Ancef IV.  OH-Progesterone q week.  S: well but discomfort with bladder catheter/urine leakage around the catheter, so catheter removed.  Using bed pan.     Tolerating Trendelenburg position     Reports good fetal activity     Contractions:none     Vaginal bleeding:none now      Vaginal discharge: no significant change  O: BP 101/53 mmHg  Pulse 76  Temp(Src) 98 F (36.7 C) (Oral)  Resp 20  Ht 5\' 7"  (1.702 m)  Wt 194 lb (87.998 kg)  BMI 30.38 kg/m2  SpO2 100%  LMP 01/19/2014      Fetal tracings:Fetal heart variability: moderate. No deceleration.  Base line 140/min.      Uterus non-tender      Extremities: no significant edema and no signs of DVT/Wearing PAS  Speculum/VE deferred.  4 cm/bulging membranes on admission. Ob US: Frank Breech, EFW 36%, AFI wnl.  Cervix about 3 cm dilated, membranes in cervix, but not in vagina.  A: [redacted]w[redacted]d with Cervical Incompetence.  No evidence of PTL.  Fetal well-being reassuring.  BMethazone/MgSO4 completed.  On OHProgesterone qwk.  On ABTx/GBS pos.         P: Continue current plan of care with bed rest in Trendelenburg.  Stool softeners/PAS.  Fetal monitoring.  NICU consult requested.  Informed of need for C/S if enters labor and fetus is still breech.   Teresa Bell,MARIE-LYNE  MD 07/25/2014 12:48 PM

## 2014-07-25 NOTE — Progress Notes (Signed)
Pt transferred to Antenatal

## 2014-07-26 MED ORDER — SODIUM CHLORIDE 0.9 % IJ SOLN
3.0000 mL | Freq: Two times a day (BID) | INTRAMUSCULAR | Status: DC
  Administered 2014-07-26 – 2014-07-27 (×4): 3 mL via INTRAVENOUS

## 2014-07-26 MED ORDER — ERYTHROMYCIN BASE 250 MG PO TABS
250.0000 mg | ORAL_TABLET | Freq: Four times a day (QID) | ORAL | Status: AC
  Administered 2014-07-26 – 2014-07-31 (×20): 250 mg via ORAL
  Filled 2014-07-26 (×21): qty 1

## 2014-07-26 MED ORDER — ERYTHROMYCIN BASE 250 MG PO TABS: 250.0000 mg | ORAL_TABLET | Freq: Four times a day (QID) | ORAL | Status: DC

## 2014-07-26 NOTE — Progress Notes (Signed)
Hospital day # 2 pregnancy at [redacted]w[redacted]d  Incompetent cervix.  BMethasone/MgSO4 completed.                                                                                                   Finishing Ancef IV today, PO AB x 5 days                                                                                                 OH-Progesterone qwk  S: well, reports good fetal activity      Contractions:none      Vaginal bleeding:none now       Vaginal discharge: no significant change  O: BP 89/38 mmHg  Pulse 57  Temp(Src) 98.3 F (36.8 C) (Oral)  Resp 18  Ht 5\' 7"  (1.702 m)  Wt 194 lb (87.998 kg)  BMI 30.38 kg/m2  SpO2 100%  LMP 01/19/2014      Fetal tracings:Fetal heart variability: moderate, no significant deceleration, Base line 140-150's, reviewed and reassuring      Uterus non-tender      Extremities: no significant edema and no signs of DVT  A: [redacted]w[redacted]d with Incompetent Cervix.  Stable in Trendelenburg.  No evidence of PTL/PPROM.  Fetal well-being reassuring.       P: D/C Ancef IV.  Start Erythro PO x 5 days (All to Amoxyl).  Monitoring TID.  Can eat in flat position/Trendelenburg otherwisw.  Continue current plan of care.  Dionna Wiedemann,MARIE-LYNE  MD 07/26/2014 12:47 PM

## 2014-07-27 MED ORDER — FAMOTIDINE 20 MG PO TABS
20.0000 mg | ORAL_TABLET | Freq: Every day | ORAL | Status: DC
  Administered 2014-07-27 – 2014-08-31 (×36): 20 mg via ORAL
  Filled 2014-07-27 (×36): qty 1

## 2014-07-27 MED ORDER — POLYETHYLENE GLYCOL 3350 17 G PO PACK
17.0000 g | PACK | Freq: Every day | ORAL | Status: DC
  Administered 2014-07-27 – 2014-08-31 (×28): 17 g via ORAL
  Filled 2014-07-27 (×30): qty 1

## 2014-07-27 NOTE — Plan of Care (Signed)
Problem: Phase I Progression Outcomes Goal: Bowel movement every 2 days Outcome: Progressing Will talk to MD about starting Miralax prn    Goal: OOB as tolerated unless otherwise ordered Outcome: Not Met (add Reason) CBR

## 2014-07-27 NOTE — Progress Notes (Signed)
Hospital day # 3 pregnancy at [redacted]w[redacted]d  Incompetent Cervix.  BMethasone/MgSO4/Erythro PO/OHProgesterone  S: well, reports good fetal activity      Contractions:none      Vaginal bleeding:none now       Vaginal discharge: no significant change      Constipation, Miralax qd  O: BP 97/54 mmHg  Pulse 47  Temp(Src) 98.3 F (36.8 C) (Oral)  Resp 20  Ht 5\' 7"  (1.702 m)  Wt 194 lb (87.998 kg)  BMI 30.38 kg/m2  SpO2 100%  LMP 01/19/2014      Fetal tracings:Fetal heart variability: moderate, accelerations present, no deceleration, base line 140-150, reviewed and reassuring      Uterus non-tender      Extremities: no significant edema and no signs of DVT  A: [redacted]w[redacted]d with Incompetent cervix.  No PTL/No PPROM.  Fetal-well-being reassuring.  Constipation.       P: Continue current plan of care.  Miralax qd.  Sharmila Wrobleski,MARIE-LYNE  MD 07/27/2014 9:19 AM

## 2014-07-28 NOTE — Progress Notes (Signed)
Hospital day # 4 pregnancy at [redacted]w[redacted]d with Incompetent cervix.  BMethasone/MgSO4 completed.  On Erythro PO.  OHProgesterone qwk.  S: well, was able to wash her hair.  Had a BM with Miralax.       Reports good fetal activity      Contractions:none      Vaginal bleeding:none now       Vaginal discharge: no significant change  O: BP 94/46 mmHg  Pulse 66  Temp(Src) 98.5 F (36.9 C) (Oral)  Resp 20  Ht 5\' 7"  (1.702 m)  Wt 194 lb (87.998 kg)  BMI 30.38 kg/m2  SpO2 100%  LMP 01/19/2014      Fetal tracings:Fetal heart variability: moderate, good accelerations, no deceleration.  Bline 140-150/min.      Reviewed and reassuring      Uterus non-tender      Extremities: no significant edema and no signs of DVT  A: [redacted]w[redacted]d with Incompetent cervix.  Stable with no evidence of PTL/PPROM.  Fetal well-being reassuring.       P: Continue current plan of care.  Can remove IV.  Reinsert IV if change in clinical situation.      D/c Erythro PO after 5 days.  Teresa Bell,MARIE-LYNE  MD 07/28/2014 12:33 PM

## 2014-07-29 LAB — TYPE AND SCREEN
ABO/RH(D): O POS
Antibody Screen: NEGATIVE

## 2014-07-29 NOTE — Progress Notes (Signed)
HD #5 34 w 2d Cervical insufficiency   S: NO bleeding. No LOF. Good FM. Denies contractions. No CP or SOB  O BP 87/55 mmHg  Pulse 59  Temp(Src) 97.6 F (36.4 C) (Oral)  Resp 16  Ht 5\' 7"  (1.702 m)  Wt 87.998 kg (194 lb)  BMI 30.38 kg/m2  SpO2 100%  LMP 01/19/2014  HEENT: nl Neck: supple with FROM Lungs:CTA Cv:RRR Abd: gravid , non tender No cvat Ext: neg c/c/e Pelvic : deferred Neuro: non focal  Skin : intact  FHR reassuring x 3 No contractions noted GBS positive UCS neg Sono c/w aGA, breech, nl AFI  IMP: 26+ weeks, BMZ complete Likely cervical insufficiency with advance dilatation Malpresentation GBS positive  On PO abx to complete 7d   P: PO Abx 17P weekly NST q shift Inpt care

## 2014-07-30 NOTE — Progress Notes (Signed)
HD #6 47 w 3d Cervical insufficiency   S: NO bleeding. No LOF. Good FM. Denies contractions. No CP or SOB  O BP 94/51 mmHg  Pulse 60  Temp(Src) 98.3 F (36.8 C) (Oral)  Resp 16  Ht 5\' 7"  (1.702 m)  Wt 87.998 kg (194 lb)  BMI 30.38 kg/m2  SpO2 100%  LMP 01/19/2014  Tmax 98.3  HEENT: nl Neck: supple with FROM Lungs:CTA Cv:RRR Abd: gravid , non tender No cvat Ext: neg c/c/e Pelvic : deferred Neuro: non focal  Skin : intact  FHR 150s, Category 1, Reactive NST x 3 Rare contractions noted GBS positive UCS neg Sono c/w aGA, breech, nl AFI  IMP: 27w 3d  IUP, BMZ complete, FHR reactive Likely cervical insufficiency with advanced dilatation Malpresentation GBS positive  On PO abx to complete 7d   P: PO Abx 17P weekly NST q shift Inpt care

## 2014-07-31 NOTE — Progress Notes (Signed)
HD #7 27 w 4d Cervical insufficiency   S: NO bleeding. No LOF. Good FM. Denies contractions. No CP or SOB BM x 2 since admission, strict bedrest No fevers, no pain  O BP 97/56 mmHg  Pulse 69  Temp(Src) 98.1 F (36.7 C) (Oral)  Resp 18  Ht 5\' 7"  (1.702 m)  Wt 87.998 kg (194 lb)  BMI 30.38 kg/m2  SpO2 100%  LMP 01/19/2014    Gen: lying in bed, no distress Abd: gravid , non tender Ext: neg c/c/e Pelvic : deferred Neuro: non focal  Skin : intact  NST: pending GBS positive UCx neg Sono 2/22: c/w aGA, breech, nl AFI  IMP: 27w 4d  IUP, BMZ complete, FHR reactive Cervical insufficiency with advanced dilatation and prolapsing membranes Malpresentation GBS positive  On PO abx to complete 7d   P: PO Abx to complete today 17P weekly NST q shift Inpt care Once 28 wks will need 1hr GTT and Tdap, Rh pos Continue strict bedrest.  Winthrop Shannahan A. 07/31/2014 8:54 AM

## 2014-07-31 NOTE — Progress Notes (Signed)
Ur chart review completed.  

## 2014-07-31 NOTE — Plan of Care (Signed)
Problem: Consults Goal: Birthing Suites Patient Information Press F2 to bring up selections list   Pt < [redacted] weeks EGA  Problem: Phase I Progression Outcomes Goal: LOS < 4 days Outcome: Not Applicable Date Met:  80/99/83 POC undetermined    Goal: OOB as tolerated unless otherwise ordered Outcome: Not Met (add Reason) CBR

## 2014-08-01 LAB — TYPE AND SCREEN
ABO/RH(D): O POS
Antibody Screen: NEGATIVE

## 2014-08-01 MED ORDER — TETANUS-DIPHTH-ACELL PERTUSSIS 5-2.5-18.5 LF-MCG/0.5 IM SUSP
0.5000 mL | Freq: Once | INTRAMUSCULAR | Status: AC
  Administered 2014-08-03: 0.5 mL via INTRAMUSCULAR
  Filled 2014-08-01: qty 0.5

## 2014-08-01 NOTE — Progress Notes (Signed)
Hospital day # 8 pregnancy at [redacted]w[redacted]d  Incompetent cervix.  BMethasone/MgSO4/ABProphylaxis/OHProgesterone  S: well, reports good fetal activity      Contractions:none      Vaginal bleeding:none now       Vaginal discharge: no significant change  O: BP 102/49 mmHg  Pulse 63  Temp(Src) 98.6 F (37 C) (Oral)  Resp 20  Ht 5\' 7"  (1.702 m)  Wt 190 lb 6 oz (86.354 kg)  BMI 29.81 kg/m2  SpO2 100%  LMP 01/19/2014      Fetal tracings:Fetal heart variability: moderate, accelerations present, no deceleration, Bline 140-150/min, reviewed and reassuring      Uterus non-tender      Extremities: no significant edema and no signs of DVT  A: [redacted]w[redacted]d with Incompetent cervix.  No PTL/PPROM.  Fetal well-being reassuring.  P: Continue current plan of care.  1 hr GTT/RPR/TSH reflex/Tdap at 28 wks.  Repeat US cervix/Presentation/EFW/AFI next week.  Sherronda Sweigert,MARIE-LYNE  MD 08/01/2014 11:37 PM

## 2014-08-02 NOTE — Progress Notes (Signed)
Hospital day # 9 pregnancy at [redacted]w[redacted]d.  Incompetent cervix.  Bmetha/MgSO4/OHProg/ABprophylaxis  S: well, reports good fetal activity.  BMs pos.      Contractions:none      Vaginal bleeding:none now       Vaginal discharge: no significant change  O: BP 102/50 mmHg  Pulse 62  Temp(Src) 98.4 F (36.9 C) (Oral)  Resp 18  Ht 5\' 7"  (1.702 m)  Wt 184 lb 11.2 oz (83.779 kg)  BMI 28.92 kg/m2  SpO2 100%  LMP 01/19/2014      Fetal tracings:Fetal heart variability: moderate, accelerations present, no deceleration, Bline 140-150's, reviewed and reassuring      Uterus non-tender      Extremities: no significant edema and no signs of DVT  A: [redacted]w[redacted]d with Incompetent cervix.  No PTL/PPROM.  Fetal Well-being reassuring.     P: Continue current plan of care.  1 hr GTT/CBC/RPR/Tdap/TSH tomorrow.  [place] Cervix/Presentation/EFW/AFI 3/7th.    Teresa Bell,MARIE-LYNE  MD 08/02/2014 9:17 PM

## 2014-08-02 NOTE — Progress Notes (Signed)
Antenatal Nutrition Assessment:  Currently  27 6/[redacted] weeks gestation, with Incomp cervix. Height  67 "  Weight 184 lbs  pre-pregnancy weight 175 lbs .  Pre-pregnancy  BMI 27.5  IBW 135 lbs Total weight gain 9.lbs Weight gain goals 15-25 lbs. Weight seems to vary significantly with use of bed scale to weigh pt. Lost 6 lbs overnight, which is likely due to inconsistency  of bed scale. Estimated needs: 2000-2200 kcal/day, 85-95 grams protein/day, 2.3 liters fluid/day  Regular diet tolerated well, appetite poor. Asked by RN to visit pt re: po intake. Appetite is decreased since admission. Pt has Hx of 100 Lb weight loss prior to conception with diet and exercise. Pt aware of need for weight gain, need to try to increase po intake. She is willing to try addition of snacks TID. Discussed ordering Ensure/ Resource BID,to consume  if a meal is missed. Pt would like to add Ensure after GTT, and if appetite continues to be decreased   Current diet prescription will provide for increased needs.  No abnormal nutrition related labs  Nutrition Dx: Increased nutrient needs r/t pregnancy and fetal growth requirements aeb [redacted] weeks gestation.  No educational needs assessed at this time.  Teresa Bell LDN Neonatal Nutrition Support Specialist/RD III Pager [phone]

## 2014-08-03 LAB — TYPE AND SCREEN
ABO/RH(D): O POS
Antibody Screen: NEGATIVE

## 2014-08-03 LAB — CBC
HCT: 34.1 % — ABNORMAL LOW (ref 36.0–46.0)
Hemoglobin: 11.9 g/dL — ABNORMAL LOW (ref 12.0–15.0)
MCH: 32.2 pg (ref 26.0–34.0)
MCHC: 34.9 g/dL (ref 30.0–36.0)
MCV: 92.4 fL (ref 78.0–100.0)
Platelets: 165 10*3/uL (ref 150–400)
RBC: 3.69 MIL/uL — ABNORMAL LOW (ref 3.87–5.11)
RDW: 12.4 % (ref 11.5–15.5)
WBC: 9.4 10*3/uL (ref 4.0–10.5)

## 2014-08-03 LAB — GLUCOSE TOLERANCE, 1 HOUR: GLUCOSE 1 HOUR GTT: 111 mg/dL (ref 70–140)

## 2014-08-03 LAB — TSH: TSH: 2.583 u[IU]/mL (ref 0.350–4.500)

## 2014-08-03 NOTE — Progress Notes (Signed)
Hospital day # 10 pregnancy at [redacted]w[redacted]d  Incompetent cervix.  BMethasone/MgSO4 completed.  OHProgesterone.  S: well, reports good fetal activity.  BMs most days.      Contractions: None      Vaginal bleeding:  None       Vaginal discharge: wnl  O: BP 105/59 mmHg  Pulse 59  Temp(Src) 98.4 F (36.9 C) (Oral)  Resp 18  Ht 5\' 7"  (1.702 m)  Wt 187 lb 8 oz (85.049 kg)  BMI 29.36 kg/m2  SpO2 100%  LMP 01/19/2014      Fetal tracings: B. Line 140-150/min, moderate variability, accelerations present, no significant deceleration.      Uterus:  Soft, non-tender      Extremities: wnl  1 hr GTT 111 wnl.  TSH wnl.  Hb 11.9.  Plt wnl.  A: [redacted]w[redacted]d with Incompetent cervix.  No PTL/PPROM.  Fetal Well-being reassuring.       1 hr GTT wnl.  P: Continue current plan of care.  Recheck Cervix/Presentation/Interval growth 3/7th.  Madalynne Gutmann,MARIE-LYNE  MD 08/03/2014 9:46 PM

## 2014-08-04 LAB — RPR: RPR: NONREACTIVE

## 2014-08-04 NOTE — Progress Notes (Signed)
Hospital day # 11 pregnancy at [redacted]w[redacted]d Incompetent cervix.  BMethasone/MgSO4.  OH Progesterone.  S: well, reports good fetal activity.  Last BM Monday, on Miralax/Colace.      Contractions: none      Vaginal bleeding: none       Vaginal discharge: none  O: BP 95/49 mmHg  Pulse 60  Temp(Src) 97.6 F (36.4 C) (Oral)  Resp 18  Ht 5\' 7"  (1.702 m)  Wt 188 lb 3.2 oz (85.367 kg)  BMI 29.47 kg/m2  SpO2 100%  LMP 01/19/2014      Fetal tracings: 140-150/min, good variability, accelerations, no deceleration.      Uterus Soft, non-tender      Extremities: wnl  1 hr GTT/CBC/TSH wnl.  RPR NR.  Tdap received.  A: [redacted]w[redacted]d with Incompetent cervix.  No PTL/PPROM.  Fetal well-being reassuring.       P: Continue current plan of care.  [place] 3/7th.  Teresa Bell,MARIE-LYNE  MD 08/04/2014 9:43 AM

## 2014-08-05 NOTE — Progress Notes (Signed)
Hospital day # 12 pregnancy at [redacted]w[redacted]d.  Incompetent cervix.  Bmethasone/MgSO4.  OH Progesterone.  S: well, reports good fetal activity.  BM yesterday evening.      Contractions:none      Vaginal bleeding:none now       Vaginal discharge: no significant change  O: BP 95/48 mmHg  Pulse 56  Temp(Src) 98.7 F (37.1 C) (Oral)  Resp 18  Ht 5\' 7"  (1.702 m)  Wt 179 lb 14.4 oz (81.602 kg)  BMI 28.17 kg/m2  SpO2 100%  LMP 01/19/2014      Fetal tracings:Fetal heart variability: moderate, accelerations present, no deceleration.  Bline 140's.      Reviewed and reassuring      Uterus non-tender      Extremities: no significant edema and no signs of DVT   Weighing in bed:  Unlikely accurate.  A: [redacted]w[redacted]d with Incompetent cervix.  No PPROM/PTL.  Fetal well-being reassuring.       P: Continue current plan of care.  Weigh only once a week.  Ob US 3/7th.  Xariah Silvernail,MARIE-LYNE  MD 08/05/2014 10:39 AM

## 2014-08-06 LAB — TYPE AND SCREEN
ABO/RH(D): O POS
Antibody Screen: NEGATIVE

## 2014-08-07 ENCOUNTER — Inpatient Hospital Stay (HOSPITAL_COMMUNITY): Payer: 59

## 2014-08-07 DIAGNOSIS — Z3A28 28 weeks gestation of pregnancy: Secondary | ICD-10-CM | POA: Insufficient documentation

## 2014-08-07 DIAGNOSIS — O3432 Maternal care for cervical incompetence, second trimester: Secondary | ICD-10-CM | POA: Insufficient documentation

## 2014-08-07 NOTE — Progress Notes (Addendum)
Hospital day # 13  Late Note from 08/06/14. Pregnancy at [redacted]w[redacted]d  Cervical incompetence  S: well, reports good fetal activity      Contractions:none      Vaginal bleeding:none now       Vaginal discharge: no significant change  O: BP 95/50 mmHg  Pulse 58  Temp(Src) 98.3 F (36.8 C) (Oral)  Resp 18  Ht 5\' 7"  (1.702 m)  Wt 179 lb 14.4 oz (81.602 kg)  BMI 28.17 kg/m2  SpO2 100%  LMP 01/19/2014      Fetal tracings:Fetal heart variability: moderate, no deceleration, reviewed and reassuring      Uterus non-tender      Extremities: no significant edema and no signs of DVT  A: [redacted]w[redacted]d with Incompetent cervix.  No PPROM/PTL.  Fetal Well-being reassuring.       P: Continue current plan of care.  Will choose Ped.  Will organize Breastfeeding information session in hospital.  Ob US 08/07/14.  Layne Dilauro,MARIE-LYNE  MD 08/06/2014

## 2014-08-07 NOTE — Plan of Care (Signed)
Problem: Consults Goal: Birthing Suites Patient Information Press F2 to bring up selections list  Outcome: Not Met (add Reason) Patient here for antenatal for prolonged hospitalization.

## 2014-08-07 NOTE — Evaluation (Signed)
Physical Therapy Evaluation Patient Details Name: Teresa Bell MRN: [id number] DOB: [birth year] Today's Date: 08/07/2014   History of Present Illness  Pt adm with incompetent cervix and is currently 28 wks 4 days gestation.  Clinical Impression  Pt presents to PT on bedrest due to pregnancy complications. Pt instructed in and able to perform bed exercise program. Pt verbalized understanding to stop exercises if she hand any increased contractions, pain, or discharge with exercises. PT will sign off for now. Pt may need PT for mobility after delivery. Please re-order if/when further PT needed.    Follow Up Recommendations Other (comment) (May need acute PT for mobility after delivery.)    Equipment Recommendations  None recommended by PT    Recommendations for Other Services       Precautions / Restrictions Precautions Precaution Comments: complete bedrest in trendelenburg      Mobility  Bed Mobility Overal bed mobility: Modified Independent             General bed mobility comments: with rolling. NT further due to bedrest.  Transfers                 General transfer comment: NT due to complete bedrest.  Ambulation/Gait             General Gait Details: NT due to complete bedrest.  Stairs            Wheelchair Mobility    Modified Rankin (Stroke Patients Only)       Balance                                             Pertinent Vitals/Pain Pain Assessment: No/denies pain    Home Living                        Prior Function Level of Independence: Independent               Hand Dominance        Extremity/Trunk Assessment   Upper Extremity Assessment: Overall WFL for tasks assessed           Lower Extremity Assessment: Generalized weakness (No resistance given due to pt on complete bedrest.)         Communication   Communication: No difficulties  Cognition Arousal/Alertness:  Awake/alert Behavior During Therapy: WFL for tasks assessed/performed Overall Cognitive Status: Within Functional Limits for tasks assessed                      General Comments      Exercises General Exercises - Upper Extremity Shoulder Flexion: AROM;Both;10 reps;Supine Antenatal Exercises Ankle Circles/Pumps: AROM;Both;10 reps;Supine Quad Sets: AROM;Both;10 reps;Supine Gluteal Sets: 10 reps Short Arc Quad: AROM;Both;10 reps;Supine Hip ABduction/ADduction: AROM;Both;10 reps;Supine Sidelying Hip Flexor Stretch: PROM;Left (demonstration 1 rep)      Assessment/Plan    PT Assessment Patent does not need any further PT services  PT Diagnosis Generalized weakness   PT Problem List    PT Treatment Interventions     PT Goals (Current goals can be found in the Care Plan section) Acute Rehab PT Goals PT Goal Formulation: All assessment and education complete, DC therapy    Frequency     Barriers to discharge        Co-evaluation  End of Session   Activity Tolerance: Patient tolerated treatment well Patient left: in bed;with call bell/phone within reach;with family/visitor present           Time: 0093-8182 PT Time Calculation (min) (ACUTE ONLY): 9 min   Charges:   PT Evaluation $Initial PT Evaluation Tier I: 1 Procedure     PT G Codes:        Teresa Bell 08/08/2014, 4:09 PM  [facility] PT [phone]

## 2014-08-07 NOTE — Progress Notes (Signed)
Hospital day # 14  Pregnancy at [redacted]w[redacted]d Cervical incompetence.  Bmetha/MgSO4 completed.  On OHProgesterone.  S: well, reports good fetal activity  Contractions:none  Vaginal bleeding:none now  Vaginal discharge: no significant change  O: BP 95/50 mmHg  Pulse 58  Temp(Src) 98.3 F (36.8 C) (Oral)  Resp 18  Ht 5\' 7"  (1.702 m)  Wt 179 lb 14.4 oz (81.602 kg)  BMI 28.17 kg/m2  SpO2 100%  LMP 01/19/2014  Fetal tracings:Fetal heart variability: moderate, no deceleration, reviewed and reassuring  Uterus non-tender  Extremities: no significant edema and no signs of DVT  A: [redacted]w[redacted]d with Incompetent cervix. No PPROM/PTL. Fetal Well-being reassuring.    P: Continue current plan of care. Ob US scheduled today, will review when available.  Claus Silvestro,MARIE-LYNE MD 08/07/2014

## 2014-08-08 NOTE — Progress Notes (Signed)
Hospital day # 15 pregnancy at [redacted]w[redacted]d.  Incompetent cervix.  BMethasone/MgSO4 completed.  OHProgesterone  S: well, reports good fetal activity      Contractions:none      Vaginal bleeding:none now       Vaginal discharge: no significant change  O: BP 101/47 mmHg  Pulse 59  Temp(Src) 98 F (36.7 C) (Oral)  Resp 18  Ht 5\' 7"  (1.702 m)  Wt 179 lb 14.4 oz (81.602 kg)  BMI 28.17 kg/m2  SpO2 100%  LMP 01/19/2014      Fetal tracings:Fetal heart variability: moderate, no deceleration, accelerations present, base line 140's, reviewed and reassuring      Uterus non-tender      Extremities: no significant edema and no signs of DVT   US 08/07/14:  EFW 42%, AFI 71.0 cm, cephalic.  No measurable cervix, unchanged.  A: [redacted]w[redacted]d with Incompetent cervix.  Cervix unchanged by [place].  No PPROM/PTL.  Fetal well-being reassuring.  Good interval growth/AFI wnl.  Cephalic presentation.  P: Continue current plan of care.  MFM recommend repeat interval growth in 6 wks because of Hypothyroidism.  Will recheck cervix by speculum exam at 31-32 wks.         Teresa Bell,MARIE-LYNE  MD 08/08/2014 3:18 PM

## 2014-08-09 LAB — TYPE AND SCREEN
ABO/RH(D): O POS
Antibody Screen: NEGATIVE

## 2014-08-09 NOTE — Progress Notes (Signed)
Hospital day # 16 pregnancy at [redacted]w[redacted]d  Incompetent cervix.  BMethasone/MgSO4.  OH Progesterone.  S: well, reports good fetal activity      Contractions:none      Vaginal bleeding:none now       Vaginal discharge: no significant change  O: BP 82/45 mmHg  Pulse 71  Temp(Src) 98.3 F (36.8 C) (Oral)  Resp 18  Ht 5\' 7"  (1.702 m)  Wt 188 lb (85.276 kg)  BMI 29.44 kg/m2  SpO2 100%  LMP 01/19/2014      Fetal tracings:Fetal heart variability: moderate, accelerations present, no deceleration, reviewed and reassuring      Uterus non-tender      Extremities: no significant edema and no signs of DVT  A: [redacted]w[redacted]d with Incompetent cervix.  No PPROM/PTL.  Fetal Well-being reassuring.  AGA per [place].       P: Continue current plan of care.  Transferring to my partner's care during my vacation from today to 3/25th.  Gustabo Gordillo,MARIE-LYNE  MD 08/09/2014 12:18 PM

## 2014-08-09 NOTE — Consult Note (Signed)
Initial consult with this mom, still in antenatal, and close ro [redacted] weeks pregnant.  Lactation consult request was put in by mom's MD, Dr. Dellis Filbert. Mom is a [employer] empoy[employer], and will get a DEP. i reviewed the NICU booklet on providing EBM for the NICU baby, gave mom yellow stickers and colostrum snap top collectors. Mom receptive to teaching, few questions at this time.

## 2014-08-10 NOTE — Progress Notes (Signed)
Ur chart review completed.  

## 2014-08-10 NOTE — Progress Notes (Signed)
Hospital day # 17 pregnancy at 29.0 wks, Incompetent cervix.  BMethasone/MgSO4.  OH Progesterone- wkly.  S: well, reports good fetal activity      Contractions:none      Vaginal bleeding:none now       Vaginal discharge: no significant change  O: BP 103/58 mmHg  Pulse 61  Temp(Src) 98.2 F (36.8 C) (Oral)  Resp 20  Ht 5\' 7"  (1.702 m)  Wt 188 lb (85.276 kg)  BMI 29.44 kg/m2  SpO2 100%  LMP 01/19/2014      Fetal tracings:Fetal heart variability: moderate, accelerations present, no deceleration- reassuring, toco- none       Uterus non-tender, no contractions, soft uterus       Extremities: no significant edema and no signs of DVT       Cx check deferred: per note 3-4 cm, stable. Vtx  A: 29 with Incompetent cervix.  No PPROM/PTL.  Fetal Well-being reassuring.  AGA per [place]. Cephalic per last sono,      P: Continue current plan of care. Bedrest, stays in-patient.   Shiori Adcox R  MD 08/10/2014 2:08 PM

## 2014-08-11 NOTE — Progress Notes (Signed)
S: c/o headache (+) FM O: VSS afebrile Lungs clear to A Cor RRR Abd: gravid nontender Pelvic deferred  IMP: cervical incompetence IUP @ 29 1/7 week AGA  s/p BMZ, [place] P) cont inpt status

## 2014-08-11 NOTE — Progress Notes (Signed)
Pt tolerated well, with minimal dizziness and wobbly legs

## 2014-08-12 LAB — TYPE AND SCREEN
ABO/RH(D): O POS
Antibody Screen: NEGATIVE

## 2014-08-12 NOTE — Progress Notes (Signed)
Hospital day # 19 pregnancy at 29.2 wks, Incompetent cervix. s/p BMethasone/MgSO4. [place] Progesterone- wkly.  S: well, reports good fetal activity  Contractions:none  Vaginal bleeding:none now  Vaginal discharge: no significant change Wearing SCDs at night, doing bed exercises in day  O: Filed Vitals:   08/12/14 0555 08/12/14 0801 08/12/14 1138 08/12/14 1656  BP: 98/48 95/49 96/56  103/49  Pulse: 61 56 56 64  Temp: 98.2 F (36.8 C) 98.6 F (37 C) 98.2 F (36.8 C) 98.2 F (36.8 C)  TempSrc: Oral Oral Oral Oral  Resp: 18  18 18   Height:      Weight:      SpO2:         Fetal tracings:b/l 150's Fetal heart variability: moderate, accelerations present, no deceleration- reassuring, toco- none   Uterus non-tender, no contractions, soft uterus   Extremities: no significant edema and no signs of DVT   Cx check deferred: per note 3-4 cm, stable. Vtx on most recent u/s  A: 29.2 wks  with Incompetent cervix. No PPROM/PTL. Fetal Well-being reassuring. AGA per [place]. Cephalic per last sono Cont bedrest, added bathroom privileges  Samuel Rittenhouse A. 08/12/2014 6:26 PM     P: Continue current plan of care. Bedrest, stays in-patient.

## 2014-08-13 NOTE — Progress Notes (Signed)
Hospital day # 20 pregnancy at 29.3 wks, Incompetent cervix. s/p BMethasone/MgSO4. [place] Progesterone- wkly.  S: well, reports good fetal activity  Contractions:none  Vaginal bleeding:none  Vaginal discharge: no significant change Wearing SCDs at night, doing bed exercises in day  O: Filed Vitals:   08/13/14 0549 08/13/14 0900 08/13/14 1157 08/13/14 1612  BP: 99/49 88/47 100/50 102/53  Pulse: 65 58 60 63  Temp: 98.6 F (37 C) 98 F (36.7 C) 98.4 F (36.9 C) 98.2 F (36.8 C)  TempSrc: Oral Oral Oral Oral  Resp: 18 16 16 16   Height:      Weight:      SpO2:         Fetal tracings:b/l 145's Fetal heart variability: moderate, accelerations present, no deceleration- reassuring, toco- none   Uterus non-tender, no contractions, soft uterus   Extremities: no significant edema and no signs of DVT   Cx check deferred: per note 3-4 cm, stable. Vtx on most recent u/s  A: 29.3 wks  with Incompetent cervix. No PPROM/PTL. Fetal Well-being reassuring. AGA per [place]. Cephalic per last sono Cont bedrest, Cont weekly 17-P, bathroom privileges  Teresa Bell A. 08/13/2014 4:45 PM

## 2014-08-14 NOTE — Progress Notes (Signed)
Hospital day # 20 pregnancy at 29.4 wks, Incompetent cervix. s/p BMethasone/MgSO4. [place] Progesterone- wkly.  S: well, reports good fetal activity, no pain/ contractions/ vag pressure/ vaginal bleeding or excessive vag discharge  Wearing SCDs at night, doing bed exercises in day  O: BP 96/54 mmHg  Pulse 57  Temp(Src) 98.5 F (36.9 C) (Oral)  Resp 20  Ht 5\' 7"  (1.702 m)  Wt 188 lb (85.276 kg)  BMI 29.44 kg/m2  SpO2 100%  LMP 01/19/2014    Fetal tracings: 145's Fetal heart variability: moderate, accelerations present, no deceleration- reassuring, toco- none   Uterus non-tender, no contractions, soft uterus   Extremities: no significant edema and no signs of DVT   Cx check deferred: per note 3-4 cm, stable. Vtx on most recent u/s (08/07/14)  A: 29.4 wks  with Incompetent cervix and cervical dilatation. Cont weekly 17-P. No PPROM or active PTL. Fetal Well-being reassuring. AGA per [place]. Cephalic per last sono on 08/07/14 and plan growth sono in 3- 4 wks from then Cont bedrest with bathroom privileges.   Teresa Bell R 08/14/2014 11:45 AM

## 2014-08-14 NOTE — Progress Notes (Signed)
Ur chart review completed.  

## 2014-08-15 LAB — TYPE AND SCREEN
ABO/RH(D): O POS
ANTIBODY SCREEN: NEGATIVE

## 2014-08-15 NOTE — Progress Notes (Signed)
S: no complaint. Requesting to go to baby shower here on 3/21 (+) FM (-) ctx  O: VSS afebrile Lungs clear to A  Cor: RRR grade 2/6 SEM ABD; gravid nontender Pelvic" deferred Extr: no edema  Tracing: baseline 145 (+) accels to 155 No ctx  IMP: Preterm @ 29 5/7 weeks BMZ complete Cervical incompetence GBS cx (+) P) cont inpt status due to increased risk for preterm delivery. May go via stretcher to shower here

## 2014-08-16 NOTE — Progress Notes (Signed)
HD #23 74 w 6d Cervical insufficiency   S: NO bleeding. No LOF. Good FM. Denies contractions. No CP or SOB  O BP 96/50 mmHg  Pulse 72  Temp(Src) 98.7 F (37.1 C) (Oral)  Resp 16  Ht 5\' 7"  (1.702 m)  Wt 85.276 kg (188 lb)  BMI 29.44 kg/m2  SpO2 100%  LMP 01/19/2014  Tmax 98.3  HEENT: nl Neck: supple with FROM Lungs:CTA Cv:RRR Abd: gravid , non tender No CVAT Ext: neg c/c/e Pelvic : deferred Neuro: non focal  Skin : intact  FHR 140s -150s, Category 1, Reactive NST x 3 Rare contractions noted Sono 3/7- AGA, nl AFI, cephalic presentation BMZ 2/22-2/23   IMP: 29w 6d  IUP  Cervical insufficiency with advanced dilatation AGA fetus now cephalic with reassuring FHR surveillance Hypothyroidism stable (nl TSH 3/5) GBS positive s/p ABX course   P: 17P weekly NST q shift Inpt care Serial sono for growth Rpt Mg neuroprophylaxis if active labor occurs Consider BMZ booster if delivery imminent Discussed with MFM- pt ok to continue BRP and shower.

## 2014-08-17 NOTE — Progress Notes (Signed)
HD #24 30w Cervical insufficiency   S: NO bleeding. No LOF. No pain, no fevers Good FM. Denies contractions. No VB, no LOF Wearing SCDs at night  O Filed Vitals:   08/16/14 1931 08/16/14 2114 08/16/14 2150 08/17/14 0746  BP: 88/40   94/48  Pulse: 62   58  Temp: 98.1 F (36.7 C)   98.1 F (36.7 C)  TempSrc: Oral   Oral  Resp: 16 18 18 16   Height:      Weight:      SpO2:         Abd: gravid , non tender, no fundal tenderness Ext: neg c/c/e Pelvic : deferred Neuro: non focal  Skin : intact  NST pending  Sono 3/7- AGA, nl AFI, cephalic presentation BMZ 2/22-2/23   IMP: 30w IUP  Cervical insufficiency with advanced dilatation AGA fetus now cephalic with reassuring FHR surveillance Hypothyroidism stable (nl TSH 3/5) GBS positive s/p ABX course   P: 17P weekly NST q shift Inpt care Serial sono for growth, repeat 3 wks from last Rpt Mg neuroprophylaxis if active labor occurs Consider BMZ booster if delivery imminent  Teresa Bell A. 08/17/2014 9:05 AM

## 2014-08-18 LAB — TYPE AND SCREEN
ABO/RH(D): O POS
Antibody Screen: NEGATIVE

## 2014-08-18 NOTE — Progress Notes (Signed)
HD #24 30.1 wks Cervical insufficiency   S: NO bleeding. No LOF. No pain, no fevers Good FM. Denies contractions. No VB, no LOF Wearing SCDs at night  O Filed Vitals:   08/17/14 2026 08/17/14 2355 08/18/14 0803 08/18/14 1139  BP: 105/56 96/50 93/49  97/56  Pulse: 56 51 61 67  Temp: 98 F (36.7 C) 97.8 F (36.6 C) 98.1 F (36.7 C) 97.9 F (36.6 C)  TempSrc: Oral Oral Oral Oral  Resp: 18 18 18 18   Height:      Weight:      SpO2:         Abd: gravid , non tender, no fundal tenderness Ext: neg c/c/e Pelvic : deferred Neuro: non focal  Skin : intact  NST reactive   Sono 3/7- AGA, nl AFI, cephalic presentation BMZ 2/22-2/23  IMP: 30.1w IUP  Cervical insufficiency with advanced dilatation AGA fetus now cephalic with reassuring FHR surveillance Hypothyroidism stable (nl TSH 3/5) GBS positive s/p ABX course   P: 17P weekly NST q shift Inpt care Serial sono for growth, repeat 3/28  Rpt Mg neuroprophylaxis if active labor occurs Consider BMZ booster if delivery imminent  Teresa Bell 08/18/2014 1:47 PM

## 2014-08-19 NOTE — Progress Notes (Signed)
S: no complaint. Notes increase hair growth since taking weekly injection (+) FM (-) ctx  O: Blood pressure 99/50, pulse 57, temperature 98.1 F (36.7 C), temperature source Oral, resp. rate 20, height 5\' 7"  (1.702 m), weight 192 lb 4.8 oz (87.227 kg), last menstrual period 01/19/2014, SpO2 100 %.  Lungs clear to A  Cor: RRR grade 2/6 SEM ABD; gravid nontender Pelvic: deferred. No pad Extr: no edema or calf tenderness  Tracing: baseline 140's  (+) accel to 155  No ctx  IMP: Preterm @ 30 2//7 weeks BMZ complete Cervical incompetence on weekly 17OHP GBS cx (+) Hypothyroidism on synthroid P) cont inpt status. Recommend  Remain until 34 weeks. Ok for baby shower here Monday. Fetal growth sched 3/28

## 2014-08-20 NOTE — Progress Notes (Signed)
S: no complaint. Spirits better. Baby shower tomorrow (+) FM (-) ctx  O: Blood pressure 102/57, pulse 67, temperature 97.2 F (36.2 C), temperature source Oral, resp. rate 18, height 5\' 7"  (1.702 m), weight 87.227 kg (192 lb 4.8 oz), last menstrual period 01/19/2014, SpO2 100 %.   Lungs clear to A  Cor: RRR grade 2/6 SEM ABD; gravid nontender Pelvic:No pad or discharge Extr: no edema or calf tenderness  Tracing: baseline 140's  (+) accel to 155  No ctx  IMP: Preterm @ 30 3//7 weeks BMZ complete Cervical incompetence on weekly 17OHP GBS cx (+) Hypothyroidism on synthroid P) Fetal growth sched 3/28. Cont present mgmt

## 2014-08-21 LAB — TYPE AND SCREEN
ABO/RH(D): O POS
Antibody Screen: NEGATIVE

## 2014-08-21 NOTE — Progress Notes (Signed)
HD #28 30'4w Cervical insufficiency   S: NO bleeding. No LOF. No pain, no fevers Good FM. Denies contractions. No VB, no LOF Wearing SCDs at night  O Filed Vitals:   08/20/14 1631 08/20/14 2000 08/20/14 2333 08/21/14 0806  BP: 98/54 104/51 95/46 98/50   Pulse: 60 63 62 58  Temp: 97.6 F (36.4 C) 98 F (36.7 C) 97.7 F (36.5 C) 98.3 F (36.8 C)  TempSrc: Oral Oral Oral Oral  Resp: 18 18 18 16   Height:      Weight:      SpO2:                                                                 Abd: gravid , non tender, no fundal tenderness Ext: neg c/c/e Pelvic : deferred Neuro: non focal  Skin : intact  NST pending  Sono 3/7- AGA, nl AFI, cephalic presentation BMZ 2/22-2/23   IMP: 70w4dthough IUP  Cervical insufficiency with advanced dilatation AGA fetus now cephalic with reassuring FHR surveillance Hypothyroidism stable (nl TSH 3/5) GBS positive s/p ABX course   P: 17P weekly NST q shift Inpt care Serial sono for growth, repeat 3 wks from last Rpt Mg neuroprophylaxis if active labor occurs Consider BMZ booster if delivery imminent     Dhalia Zingaro A. 08/21/2014 10:36 AM

## 2014-08-22 NOTE — Progress Notes (Signed)
HD #29 30 w 5d Cervical insufficiency   S: NO bleeding. No LOF. Good FM. Denies contractions. No CP or SOB  O BP 98/48 mmHg  Pulse 57  Temp(Src) 98.2 F (36.8 C) (Oral)  Resp 18  Ht 5\' 7"  (1.702 m)  Wt 87.227 kg (192 lb 4.8 oz)  BMI 30.11 kg/m2  SpO2 100%  LMP 01/19/2014  Tmax 98.5  HEENT: nl Neck: supple with FROM Lungs:CTA Cv:RRR Abd: gravid , non tender No CVAT Ext: neg c/c/e Pelvic : deferred Neuro: non focal  Skin : intact  FHR 140s -150s, Category 1, Reactive NST x 3 Rare contractions noted Sono 3/7- AGA, nl AFI, cephalic presentation BMZ 2/22-2/23   IMP: 30w 5d  IUP  Cervical insufficiency with advanced dilatation AGA fetus now cephalic with reassuring FHR surveillance Hypothyroidism stable (nl TSH 3/5) GBS positive s/p ABX course   P: 17P weekly NST q shift Inpt care Serial sono for growth- fu scheduled Monday Rpt Mg neuroprophylaxis if active labor occurs before 32 weeks Consider BMZ booster if delivery imminent Discussed with MFM- pt ok to continue BRP and shower.

## 2014-08-23 LAB — URINE MICROSCOPIC-ADD ON

## 2014-08-23 LAB — URINALYSIS, ROUTINE W REFLEX MICROSCOPIC
BILIRUBIN URINE: NEGATIVE
Glucose, UA: NEGATIVE mg/dL
Ketones, ur: NEGATIVE mg/dL
NITRITE: NEGATIVE
Protein, ur: NEGATIVE mg/dL
SPECIFIC GRAVITY, URINE: 1.02 (ref 1.005–1.030)
UROBILINOGEN UA: 0.2 mg/dL (ref 0.0–1.0)
pH: 6.5 (ref 5.0–8.0)

## 2014-08-23 NOTE — Progress Notes (Signed)
S: c/o feeling like bladder not emptying fully. Denies urinary odor or frequency. No dysuria. (+) FM. Had a great baby shower (+) FM (-) ctx  O: Blood pressure 95/46, pulse 53, temperature 98.3 F (36.8 C), temperature source Oral, resp. rate 18, height 5\' 7"  (1.702 m), weight 87.227 kg (192 lb 4.8 oz), last menstrual period 01/19/2014, SpO2 100 %.  Lungs clear to A  Cor: RRR grade 2/6 SEM ABD; gravid nontender Pelvic:No pad or discharge Extr: no edema or calf tenderness  Tracing. Baseline 140 (+) accel to 160's small variables no ctx  IMP: Preterm @ 30 6//7 weeks BMZ complete Cervical incompetence on weekly 17OHP GBS cx (+) Hypothyroidism on synthroid Incomplete bladder emptying  P).  U/a, ucx. Rn to check with Dr Dellis Filbert regarding growth/sono recommendation by MFM. Cont present mgmt.

## 2014-08-24 LAB — TYPE AND SCREEN
ABO/RH(D): O POS
Antibody Screen: NEGATIVE

## 2014-08-24 LAB — CULTURE, OB URINE
Colony Count: NO GROWTH
Culture: NO GROWTH
Special Requests: NORMAL

## 2014-08-24 NOTE — Progress Notes (Signed)
HD #31 31w Cervical insufficiency   S: NO bleeding. No LOF. No pain, no fevers Good FM. Denies contractions. No VB, no LOF Wearing SCDs at night  O Filed Vitals:   08/23/14 2009 08/23/14 2338 08/24/14 0645 08/24/14 0759  BP: 98/53 88/42 93/54  96/56  Pulse: 61 58 64 62  Temp: 97.7 F (36.5 C) 98.2 F (36.8 C) 98.4 F (36.9 C) 98.1 F (36.7 C)  TempSrc: Oral Oral Oral Oral  Resp: 16 16 16 18   Height:      Weight:      SpO2:         Abd: gravid , non tender, no fundal tenderness Ext: neg c/c/e Pelvic : deferred Neuro: non focal  Skin : intact  NST pending  Sono 3/7- AGA, nl AFI, cephalic presentation BMZ 2/22-2/23   IMP: 31w  IUP  Cervical insufficiency with advanced dilatation AGA fetus now cephalic with reassuring FHR surveillance Hypothyroidism stable (nl TSH 3/5) GBS positive s/p ABX course   P: 17P weekly NST q shift Inpt care Serial sono for growth, repeat 3 wks from last Rpt Mg neuroprophylaxis if active labor occurs prior to 32 wks Consider BMZ booster if delivery imminent

## 2014-08-25 NOTE — Progress Notes (Signed)
HD #32 31.1wks Cervical insufficiency   S:  BP 83/56 mmHg  Pulse 77  Temp(Src) 97.7 F (36.5 C) (Oral)  Resp 18  Ht 5\' 7"  (1.702 m)  Wt 194 lb 1.6 oz (88.043 kg)  BMI 30.39 kg/m2  SpO2 100%  LMP 01/19/2014  No complaints. No LOF. No pain, no fevers Good FM. Denies contractions. No VB, no LOF Wearing SCDs at night  O Filed Vitals:   08/24/14 1604 08/24/14 2033 08/25/14 0633 08/25/14 0841  BP: 104/58 105/53 83/56   Pulse: 69 63 77   Temp: 97.7 F (36.5 C) 97.4 F (36.3 C) 98.3 F (36.8 C) 97.7 F (36.5 C)  TempSrc: Oral Oral Oral Oral  Resp: 18 18 16 18   Height:      Weight:      SpO2:       Abd: gravid , non tender, no fundal tenderness Ext: neg c/c/e Pelvic : deferred Neuro: non focal  Skin : intact  NST reactive  Sono 3/7- AGA, nl AFI, cephalic presentation BMZ 2/22-2/23   IMP: 31.1 w  IUP  Cervical insufficiency with advanced dilatation AGA fetus now cephalic with reassuring FHR surveillance Hypothyroidism stable (nl TSH 3/5) GBS positive s/p ABX course   P: 17P weekly NST q shift Inpt care Serial sono for growth, repeat 3 wks from last Rpt Mg neuroprophylaxis if active labor occurs prior to 32 wks Consider BMZ booster if delivery imminent

## 2014-08-26 NOTE — Progress Notes (Signed)
Hospital day # 33 pregnancy at [redacted]w[redacted]d  Cervical Incompetence.  BMethasone completed.  S: well, reports good fetal activity      Contractions:none      Vaginal bleeding:none now       Vaginal discharge: no significant change  O: BP 94/51 mmHg  Pulse 61  Temp(Src) 98.4 F (36.9 C) (Oral)  Resp 16  Ht 5\' 7"  (1.702 m)  Wt 194 lb 1.6 oz (88.043 kg)  BMI 30.39 kg/m2  SpO2 100%  LMP 01/19/2014      Fetal tracings:Fetal heart variability: moderate, acceleration present, base line 130/min.  Reactive NST this am.      Uterus non-tender      Extremities: no significant edema and no signs of DVT  A: [redacted]w[redacted]d with Cervical incompetence.  Fetal Well-being reassuring.       P: Continue current plan of care.  Will repeat Ob US 3/28th.  Will consider d/c home on bed rest at 32 wks per clinical findings at that time.    Patricia Perales,MARIE-LYNE  MD 08/26/2014 11:06 AM

## 2014-08-27 LAB — TYPE AND SCREEN
ABO/RH(D): O POS
Antibody Screen: NEGATIVE

## 2014-08-27 NOTE — Progress Notes (Signed)
Hospital day # 34 pregnancy at [redacted]w[redacted]d Incompetent cervix  S: well, reports good fetal activity      Contractions:none      Vaginal bleeding:none now       Vaginal discharge: no significant change  O: BP 96/55 mmHg  Pulse 66  Temp(Src) 97.9 F (36.6 C) (Oral)  Resp 18  Ht 5\' 7"  (1.702 m)  Wt 194 lb 1.6 oz (88.043 kg)  BMI 30.39 kg/m2  SpO2 100%  LMP 01/19/2014      Fetal tracings:Fetal heart variability: moderate, base line 130's with good accelerations, reactive NST this am.      Uterus non-tender      Extremities: no significant edema and no signs of DVT  A: [redacted]w[redacted]d with Incompetent cervix.  Stable.  Fetal well-being reassuring.       P: Continue current plan of care.  Ob US Monday.  Long term plan to decide at 32 wks.   Shaqueta Casady,MARIE-LYNE  MD 08/27/2014 10:05 AM

## 2014-08-28 ENCOUNTER — Inpatient Hospital Stay (HOSPITAL_COMMUNITY): Payer: 59

## 2014-08-28 DIAGNOSIS — O3433 Maternal care for cervical incompetence, third trimester: Secondary | ICD-10-CM | POA: Insufficient documentation

## 2014-08-28 DIAGNOSIS — E039 Hypothyroidism, unspecified: Secondary | ICD-10-CM | POA: Insufficient documentation

## 2014-08-28 DIAGNOSIS — O99283 Endocrine, nutritional and metabolic diseases complicating pregnancy, third trimester: Secondary | ICD-10-CM

## 2014-08-28 DIAGNOSIS — Z3A31 31 weeks gestation of pregnancy: Secondary | ICD-10-CM | POA: Insufficient documentation

## 2014-08-28 NOTE — Plan of Care (Signed)
Problem: Phase I Progression Outcomes Goal: OOB as tolerated unless otherwise ordered Outcome: Progressing Pt with every other day showers, BRP, and occassional W/C priveledges

## 2014-08-28 NOTE — Progress Notes (Signed)
Hospital day # 35 pregnancy at [redacted]w[redacted]d  Cervical Incometence.  BMethasone completed.  S: well, reports good fetal activity      Contractions:none      Vaginal bleeding:none now       Vaginal discharge: no significant change  O: BP 104/51 mmHg  Pulse 76  Temp(Src) 98.4 F (36.9 C) (Oral)  Resp 20  Ht 5\' 7"  (1.702 m)  Wt 194 lb 1.6 oz (88.043 kg)  BMI 30.39 kg/m2  SpO2 100%  LMP 01/19/2014      Fetal tracings:Fetal heart variability: moderate, accelerations present, no deceleration, base line 130-140's.       Reactive NST.      Uterus non-tender      Extremities: no significant edema and no signs of DVT   OB US today EFW 46%, AFI wnl.  Cephalic.  A: [redacted]w[redacted]d with Cervical incompetence.  Stable.  AGA per [place] today at 20%, AFI wnl, cephalic.       P: Continue current plan of care.  Reevaluate Cervix with VE at 32 wks.  Cason Luffman,MARIE-LYNE  MD 08/28/2014 4:54 PM

## 2014-08-29 NOTE — Plan of Care (Signed)
Problem: Consults Goal: Birthing Suites Patient Information Press F2 to bring up selections list  Outcome: Not Applicable Date Met:  15/80/63  Antenatal Patient (< 37 weeks) Goal: NICU Tour Outcome: Not Progressing No tour needed at present  Problem: Phase I Progression Outcomes Goal: OOB as tolerated unless otherwise ordered Outcome: Progressing Had w/c ride today

## 2014-08-29 NOTE — Progress Notes (Signed)
Hospital day # 36 pregnancy at [redacted]w[redacted]d  Cervical Incompetence.  BMethasone completed.  S: well, reports good fetal activity      Contractions:none      Vaginal bleeding:none now       Vaginal discharge: no significant change  O: BP 102/55 mmHg  Pulse 67  Temp(Src) 98.2 F (36.8 C) (Oral)  Resp 20  Ht 5\' 7"  (1.702 m)  Wt 194 lb 1.6 oz (88.043 kg)  BMI 30.39 kg/m2  SpO2 100%  LMP 01/19/2014      Fetal tracings:Fetal heart variability: moderate, accelerations present, no deceleration, reactive NST.  Bline      130-140's per min.      Uterus non-tender      Extremities: no significant edema and no signs of DVT  A: [redacted]w[redacted]d with Cervical incompetence.  Fetal well-Being reassuring.         P: Continue current plan of care.    Constancia Geeting,MARIE-LYNE  MD 08/29/2014 8:10 PM

## 2014-08-30 LAB — TYPE AND SCREEN
ABO/RH(D): O POS
ANTIBODY SCREEN: NEGATIVE

## 2014-08-30 NOTE — Progress Notes (Signed)
Hospital day # 37 pregnancy at [redacted]w[redacted]d  Cervical Incompetence.  BMethasone completed.  S: well, reports good fetal activity      Contractions:none      Vaginal bleeding:none now       Vaginal discharge: no significant change  O: BP 102/55 mmHg  Pulse 67  Temp(Src) 98.2 F (36.8 C) (Oral)  Resp 18  Ht 5\' 7"  (1.702 m)  Wt 194 lb 1.6 oz (88.043 kg)  BMI 30.39 kg/m2  SpO2 100%  LMP 01/19/2014      Fetal tracings:Fetal heart variability: moderate, accelerations present, Bline 130-140's, reactive NST.      Uterus non-tender      Extremities: no significant edema and no signs of DVT  A: [redacted]w[redacted]d with Cervical Incompetence.       P: Continue current plan of care.  Evaluate tomorrow at 32 wks for possible d/c home on bed rest.  Breyton Vanscyoc,MARIE-LYNE  MD 08/30/2014 9:32 AM

## 2014-08-31 NOTE — Progress Notes (Signed)
Pt given discharge instructions and states that she understands follow up appt and ptl precautions and bedrest and pelvis rest at home.

## 2014-08-31 NOTE — Progress Notes (Signed)
Hospital day # 38 pregnancy at [redacted]w[redacted]d  Incompetent cervix  S: well, reports good fetal activity      Contractions:none      Vaginal bleeding:none now       Vaginal discharge: no significant change  O: BP 110/57 mmHg  Pulse 76  Temp(Src) 98.3 F (36.8 C) (Oral)  Resp 18  Ht 5\' 7"  (1.702 m)  Wt 195 lb 4.8 oz (88.587 kg)  BMI 30.58 kg/m2  SpO2 100%  LMP 01/19/2014      Fetal tracings:Fetal heart variability: moderate, no deceleration, accelerations present, base line 130-140's.       NST reactive.      Uterus non-tender      Extremities: no significant edema and no signs of DVT        VE today:  3 cm/90%/Vtx/floating.  Membranes intact.  A: [redacted]w[redacted]d with Incompetent cervix.  Stable x admission, confirmed by VE today.  Fetal well-being reassuring.      P:  D/C home on complete bed rest.  Risks of PPROM/PTL/PTB discussed.  Continue [place] Progesterone qwk until 36 wks.  Continue Myralax and Colace qd.  F/U W.Ob-Gyn in 1 wk.    Kamarion Zagami,MARIE-LYNE  MD 08/31/2014 9:14 AM

## 2014-08-31 NOTE — Discharge Instructions (Signed)
Cervical Insufficiency Cervical insufficiency is when the cervix is weak and starts to open (dilate) and thin (efface) before the pregnancy is at term and without labor starting. This is also called incompetent cervix. It can happen in the second or third trimester when the fetus starts putting pressure on the cervix. Cervical insufficiency can lead to a miscarriage, preterm premature rupture of the membranes (PPROM), or having the baby early (preterm birth).  RISK FACTORS You may be more likely to develop cervical insufficiency if:  You have a shorter cervix than normal.  Damage or injury occurred to your cervix from a past pregnancy or surgery.  You were born with a cervical defect.  You have had a procedure done on the cervix, such as cervical biopsy.  You have a history of cervical insufficiency.  You have a history of PPROM.  You have ended several past pregnancies through abortion.  You were exposed to the drug diethylstilbestrol (DES). SYMPTOMS Often times, women do not have any symptoms. Other times, women may only have mild symptoms that often start between week 14 through 20. The symptoms may last several days or weeks. These symptoms include:  Light spotting or bleeding from the vagina.  Pelvic pressure.  A change in vaginal discharge, such as discharge that changes from clear, white, or light yellow to pink or tan.  Back pain.  Abdominal pain or cramping. DIAGNOSIS Cervical insufficiency cannot be diagnosed before you become pregnant. Once you are pregnant, your health care provider will ask about your medical history and if you have had any problems in past pregnancies. Tell your health care provider about any procedures performed on your cervix or if you have a history of miscarriages or cervical insufficiency. If your health care provider thinks you are at high risk for cervical insufficiency or show signs of cervical insufficiency, he or she may:  Perform a pelvic  exam. This will check for:  The presence of the membranes (amniotic sac) coming out of the cervix.  Cervical abnormalities.  Cervical injuries.  The presence of contractions.  Perform an ultrasonography (commonly called ultrasound) to measure the length and thickness of the cervix. TREATMENT If you have been diagnosed with cervical insufficiency, your health care provider may recommend:  Limiting physical activity.  Bed rest at home or in the hospital.  Pelvic rest, which means no sexual intercourse or placing anything in the vagina.  Cerclage to sew the cervix closed and prevent it from opening too early. The stitches (sutures) are removed between weeks 36 and 38 to avoid problems during labor. Cerclage may be recommended during pregnancy if you have had a history of miscarriages or preterm births without a known cause. It may also be recommended if you have a short cervix that was identified by ultrasound or if your health care provider has found that your cervix has dilated before 24 weeks of pregnancy. Limiting physical activity and bed rest may or may not help prevent a preterm birth. WHEN SHOULD YOU SEEK IMMEDIATE MEDICAL CARE?  Seek immediate medical care if you show any symptoms of cervical insufficiency. You will need to go to the hospital to get checked immediately. Document Released: 05/19/2005 Document Revised: 10/03/2013 Document Reviewed: 07/26/2012 [facility] Patient Information 2015 [place], [place]. This information is not intended to replace advice given to you by your health care provider. Make sure you discuss any questions you have with your health care provider.

## 2014-08-31 NOTE — Plan of Care (Signed)
Problem: Discharge Progression Outcomes Goal: Assess/arrange home care Outcome: Completed/Met Date Met:  08/31/14 Pt will have family members staying with her at all times

## 2014-08-31 NOTE — Discharge Summary (Signed)
  ANTENATAL DISCHARGE SUMMARY  Patient ID: Teresa Bell MRN: [id number] DOB/AGE: [birth year] 29 y.o.  Admit date: 07/24/2014 Discharge date: 08/31/2014  Admission Diagnoses: 26WKS, BULGING MEMBRANES  Discharge Diagnoses: 26WKS, BULGING MEMBRANES         Discharged Condition: good  Hospital Course: Stable on bed rest  Consults: NICU and MFM  Treatments: Bed rest, BetaMethasone, MgSO4, [place] Progesterone, ABprophylaxis.  Disposition: home on complete bed rest    Medication List    TAKE these medications        docusate sodium 50 MG capsule  Commonly known as:  COLACE  Take 50 mg by mouth 2 (two) times daily as needed for mild constipation or moderate constipation.     Levothyroxine Sodium 112 MCG Caps  Take 112 mcg by mouth daily before breakfast.     multivitamin-prenatal 27-0.8 MG Tabs tablet  Take 1 tablet by mouth daily at 12 noon.           Follow-up Information    Follow up with Shamiya Demeritt,MARIE-LYNE, MD In 1 week.   Specialty:  Obstetrics and Gynecology   Contact information:   [place] [address] [phone]       Signed: Princess Bruins, MD MD 08/31/2014, 11:15 AM

## 2014-10-16 ENCOUNTER — Telehealth (HOSPITAL_COMMUNITY): Payer: Self-pay | Admitting: *Deleted

## 2014-10-16 ENCOUNTER — Encounter (HOSPITAL_COMMUNITY): Payer: Self-pay | Admitting: *Deleted

## 2014-10-16 NOTE — Telephone Encounter (Signed)
Preadmission screen  

## 2014-10-18 ENCOUNTER — Other Ambulatory Visit: Payer: Self-pay | Admitting: Obstetrics & Gynecology

## 2014-10-19 ENCOUNTER — Inpatient Hospital Stay (HOSPITAL_COMMUNITY)
Admission: RE | Admit: 2014-10-19 | Discharge: 2014-10-21 | DRG: 775 | Disposition: A | Discharge status: Home / Self Care | Payer: 59 | Source: Ambulatory Visit | Attending: Obstetrics & Gynecology | Admitting: Obstetrics & Gynecology

## 2014-10-19 ENCOUNTER — Inpatient Hospital Stay (HOSPITAL_COMMUNITY): Payer: 59 | Admitting: Anesthesiology

## 2014-10-19 ENCOUNTER — Encounter (HOSPITAL_COMMUNITY): Payer: Self-pay

## 2014-10-19 DIAGNOSIS — Z3403 Encounter for supervision of normal first pregnancy, third trimester: Secondary | ICD-10-CM | POA: Diagnosis present

## 2014-10-19 DIAGNOSIS — O99824 Streptococcus B carrier state complicating childbirth: Secondary | ICD-10-CM | POA: Diagnosis present

## 2014-10-19 DIAGNOSIS — Z3A39 39 weeks gestation of pregnancy: Secondary | ICD-10-CM | POA: Diagnosis present

## 2014-10-19 LAB — CBC
HEMATOCRIT: 32.2 % — AB (ref 36.0–46.0)
Hemoglobin: 11.5 g/dL — ABNORMAL LOW (ref 12.0–15.0)
MCH: 32.6 pg (ref 26.0–34.0)
MCHC: 35.7 g/dL (ref 30.0–36.0)
MCV: 91.2 fL (ref 78.0–100.0)
Platelets: 164 10*3/uL (ref 150–400)
RBC: 3.53 MIL/uL — ABNORMAL LOW (ref 3.87–5.11)
RDW: 13 % (ref 11.5–15.5)
WBC: 10.1 10*3/uL (ref 4.0–10.5)

## 2014-10-19 LAB — TYPE AND SCREEN
ABO/RH(D): O POS
ANTIBODY SCREEN: NEGATIVE

## 2014-10-19 LAB — RPR: RPR Ser Ql: NONREACTIVE

## 2014-10-19 MED ORDER — TETANUS-DIPHTH-ACELL PERTUSSIS 5-2.5-18.5 LF-MCG/0.5 IM SUSP: 0.5000 mL | Freq: Once | INTRAMUSCULAR | Status: DC

## 2014-10-19 MED ORDER — ONDANSETRON HCL 4 MG PO TABS: 4.0000 mg | ORAL_TABLET | ORAL | Status: DC | PRN

## 2014-10-19 MED ORDER — PHENYLEPHRINE 40 MCG/ML (10ML) SYRINGE FOR IV PUSH (FOR BLOOD PRESSURE SUPPORT)
80.0000 ug | PREFILLED_SYRINGE | INTRAVENOUS | Status: DC | PRN
  Filled 2014-10-19: qty 2
  Filled 2014-10-19: qty 20

## 2014-10-19 MED ORDER — OXYCODONE-ACETAMINOPHEN 5-325 MG PO TABS: 2.0000 | ORAL_TABLET | ORAL | Status: DC | PRN

## 2014-10-19 MED ORDER — IBUPROFEN 600 MG PO TABS
600.0000 mg | ORAL_TABLET | Freq: Four times a day (QID) | ORAL | Status: DC
  Administered 2014-10-19 – 2014-10-21 (×7): 600 mg via ORAL
  Filled 2014-10-19 (×7): qty 1

## 2014-10-19 MED ORDER — DIPHENHYDRAMINE HCL 25 MG PO CAPS: 25.0000 mg | ORAL_CAPSULE | Freq: Four times a day (QID) | ORAL | Status: DC | PRN

## 2014-10-19 MED ORDER — TERBUTALINE SULFATE 1 MG/ML IJ SOLN
0.2500 mg | Freq: Once | INTRAMUSCULAR | Status: DC | PRN
  Filled 2014-10-19: qty 1

## 2014-10-19 MED ORDER — LANOLIN HYDROUS EX OINT: TOPICAL_OINTMENT | CUTANEOUS | Status: DC | PRN

## 2014-10-19 MED ORDER — BENZOCAINE-MENTHOL 20-0.5 % EX AERO
1.0000 "application " | INHALATION_SPRAY | CUTANEOUS | Status: DC | PRN
  Administered 2014-10-20 (×3): 1 via TOPICAL
  Filled 2014-10-19 (×3): qty 56

## 2014-10-19 MED ORDER — PRENATAL MULTIVITAMIN CH
1.0000 | ORAL_TABLET | Freq: Every day | ORAL | Status: DC
  Administered 2014-10-20: 1 via ORAL
  Filled 2014-10-19: qty 1

## 2014-10-19 MED ORDER — SIMETHICONE 80 MG PO CHEW: 80.0000 mg | CHEWABLE_TABLET | ORAL | Status: DC | PRN

## 2014-10-19 MED ORDER — OXYTOCIN 40 UNITS IN LACTATED RINGERS INFUSION - SIMPLE MED
1.0000 m[IU]/min | INTRAVENOUS | Status: DC
  Administered 2014-10-19: 2 m[IU]/min via INTRAVENOUS
  Filled 2014-10-19: qty 1000

## 2014-10-19 MED ORDER — LACTATED RINGERS IV SOLN: 500.0000 mL | INTRAVENOUS | Status: DC | PRN

## 2014-10-19 MED ORDER — ACETAMINOPHEN 325 MG PO TABS: 650.0000 mg | ORAL_TABLET | ORAL | Status: DC | PRN

## 2014-10-19 MED ORDER — FENTANYL 2.5 MCG/ML BUPIVACAINE 1/10 % EPIDURAL INFUSION (WH - ANES)
14.0000 mL/h | INTRAMUSCULAR | Status: DC | PRN
  Administered 2014-10-19: 14 mL/h via EPIDURAL
  Filled 2014-10-19: qty 125

## 2014-10-19 MED ORDER — OXYTOCIN 40 UNITS IN LACTATED RINGERS INFUSION - SIMPLE MED: 62.5000 mL/h | INTRAVENOUS | Status: DC

## 2014-10-19 MED ORDER — WITCH HAZEL-GLYCERIN EX PADS
1.0000 "application " | MEDICATED_PAD | CUTANEOUS | Status: DC | PRN
  Administered 2014-10-20: 1 via TOPICAL

## 2014-10-19 MED ORDER — DIBUCAINE 1 % RE OINT: 1.0000 "application " | TOPICAL_OINTMENT | RECTAL | Status: DC | PRN

## 2014-10-19 MED ORDER — SENNOSIDES-DOCUSATE SODIUM 8.6-50 MG PO TABS
2.0000 | ORAL_TABLET | ORAL | Status: DC
  Administered 2014-10-19 – 2014-10-20 (×2): 2 via ORAL
  Filled 2014-10-19 (×2): qty 2

## 2014-10-19 MED ORDER — ZOLPIDEM TARTRATE 5 MG PO TABS: 5.0000 mg | ORAL_TABLET | Freq: Every evening | ORAL | Status: DC | PRN

## 2014-10-19 MED ORDER — CITRIC ACID-SODIUM CITRATE 334-500 MG/5ML PO SOLN
30.0000 mL | ORAL | Status: DC | PRN
Start: 2014-10-19 — End: 2014-10-19

## 2014-10-19 MED ORDER — ONDANSETRON HCL 4 MG/2ML IJ SOLN: 4.0000 mg | Freq: Four times a day (QID) | INTRAMUSCULAR | Status: DC | PRN

## 2014-10-19 MED ORDER — ONDANSETRON HCL 4 MG/2ML IJ SOLN: 4.0000 mg | INTRAMUSCULAR | Status: DC | PRN

## 2014-10-19 MED ORDER — OXYTOCIN BOLUS FROM INFUSION: 500.0000 mL | INTRAVENOUS | Status: DC

## 2014-10-19 MED ORDER — EPHEDRINE 5 MG/ML INJ
10.0000 mg | INTRAVENOUS | Status: DC | PRN
  Filled 2014-10-19: qty 2

## 2014-10-19 MED ORDER — OXYCODONE-ACETAMINOPHEN 5-325 MG PO TABS: 1.0000 | ORAL_TABLET | ORAL | Status: DC | PRN

## 2014-10-19 MED ORDER — LACTATED RINGERS IV SOLN
INTRAVENOUS | Status: DC
  Administered 2014-10-19 (×2): via INTRAVENOUS

## 2014-10-19 MED ORDER — CLINDAMYCIN PHOSPHATE 900 MG/50ML IV SOLN
900.0000 mg | Freq: Three times a day (TID) | INTRAVENOUS | Status: DC
  Administered 2014-10-19 (×2): 900 mg via INTRAVENOUS
  Filled 2014-10-19 (×3): qty 50

## 2014-10-19 MED ORDER — LIDOCAINE HCL (PF) 1 % IJ SOLN
30.0000 mL | INTRAMUSCULAR | Status: DC | PRN
  Filled 2014-10-19: qty 30

## 2014-10-19 MED ORDER — LEVOTHYROXINE SODIUM 112 MCG PO TABS
112.0000 ug | ORAL_TABLET | Freq: Every day | ORAL | Status: DC
  Administered 2014-10-20 – 2014-10-21 (×2): 112 ug via ORAL
  Filled 2014-10-19 (×2): qty 1

## 2014-10-19 MED ORDER — DIPHENHYDRAMINE HCL 50 MG/ML IJ SOLN: 12.5000 mg | INTRAMUSCULAR | Status: DC | PRN

## 2014-10-19 MED ORDER — OXYCODONE-ACETAMINOPHEN 5-325 MG PO TABS
1.0000 | ORAL_TABLET | ORAL | Status: DC | PRN
  Administered 2014-10-20 (×2): 1 via ORAL
  Filled 2014-10-19 (×2): qty 1

## 2014-10-19 MED ORDER — LIDOCAINE HCL (PF) 1 % IJ SOLN
INTRAMUSCULAR | Status: DC | PRN
  Administered 2014-10-19 (×2): 9 mL

## 2014-10-19 NOTE — Anesthesia Procedure Notes (Signed)
Epidural Patient location during procedure: OB Start time: 10/19/2014 2:26 PM End time: 10/19/2014 2:32 PM  Staffing Anesthesiologist: Lyn Hollingshead Performed by: anesthesiologist   Preanesthetic Checklist Completed: patient identified, surgical consent, pre-op evaluation, timeout performed, IV checked, risks and benefits discussed and monitors and equipment checked  Epidural Patient position: sitting Prep: site prepped and draped and DuraPrep Patient monitoring: continuous pulse ox and blood pressure Approach: midline Location: L3-L4 Injection technique: LOR air  Needle:  Needle type: Tuohy  Needle gauge: 17 G Needle length: 9 cm and 9 Needle insertion depth: 7 cm Catheter type: closed end flexible Catheter size: 19 Gauge Catheter at skin depth: 12 cm Test dose: negative and Other  Assessment Sensory level: T9 Events: blood not aspirated, injection not painful, no injection resistance, negative IV test and no paresthesia  Additional Notes Reason for block:procedure for pain

## 2014-10-19 NOTE — H&P (Signed)
Teresa Bell is a 29 y.o. female G53P0 [redacted]w[redacted]d presenting for induction re term with cervical incompetence/advanced dilation with GBS pos.  OB History    Gravida Para Term Preterm AB TAB SAB Ectopic Multiple Living   1              Past Medical History  Diagnosis Date  . Polycystic ovary syndrome   . Hypothyroidism    Past Surgical History  Procedure Laterality Date  . Ankle surgery     Family History: family history includes Cancer in her maternal grandmother; Diabetes in her maternal grandfather and mother; Heart disease in her maternal grandfather and mother; Hypertension in her father and paternal grandfather. Social History:  reports that she has never smoked. She does not have any smokeless tobacco history on file. She reports that she does not drink alcohol or use illicit drugs.  Allergies  Allergen Reactions  . Amoxicillin Hives     Blood pressure 126/68, pulse 58, height 5\' 7"  (1.702 m), weight 215 lb (97.523 kg), last menstrual period 01/19/2014, SpO2 100 %. Exam Physical Exam   On Admission:  VE 6/100/Vtx/-1.  M. Intact.                            AROM clear AF at 4 hrs post Clinda IV.                            FHR monitoring 140's with very good accelerations.  No deceleration.  Bline 140's.   HPP:  Patient Active Problem List   Diagnosis Date Noted  . Labor and delivery, indication for care 10/19/2014  . [redacted] weeks gestation of pregnancy   . Cervical insufficiency during pregnancy in third trimester, antepartum   . Hypothyroidism affecting pregnancy in third trimester, antepartum   . Cervical insufficiency during pregnancy in second trimester, antepartum   . [redacted] weeks gestation of pregnancy   . Cervical incompetence affecting management of pregnancy in second trimester, antepartum 07/24/2014  . Preterm labor in second trimester without delivery 07/24/2014  . Preterm labor 07/24/2014  . [redacted] weeks gestation of pregnancy   . Encounter for fetal anatomic survey   .  Determine fetal presentation using ultrasound     Prenatal labs: ABO, Rh: --/--/O POS (05/19 0800) Antibody: NEG (05/19 0800) Rubella:  Immune RPR: Non Reactive (05/19 0800)  HBsAg: Negative (10/16 0000)  HIV: Non-reactive (10/16 0000)  Genetic testing: wnl [place] anato: wnl 1 hr GTT: wnl GBS: Positive (02/22 0000)   Assessment/Plan: 39+ wks with cervical incompetence, advanced dilation for induction.  GBS pos.  All to Pen.  Will cover with Clinda.  Fetal well-being reassuring.  Pitocin/AROM. Epidural.  Expectant management towards probable vaginal delivery.   Muriel Hannold,MARIE-LYNE 10/19/2014, 3:44 PM

## 2014-10-19 NOTE — Anesthesia Preprocedure Evaluation (Signed)
Anesthesia Evaluation  Patient identified by MRN, date of birth, ID band Patient awake    Reviewed: Allergy & Precautions, H&P , NPO status , Patient's Chart, lab work & pertinent test results  Airway Mallampati: II  TM Distance: >3 FB Neck ROM: full    Dental no notable dental hx.    Pulmonary neg pulmonary ROS,    Pulmonary exam normal       Cardiovascular negative cardio ROS Normal cardiovascular exam    Neuro/Psych negative neurological ROS  negative psych ROS   GI/Hepatic negative GI ROS, Neg liver ROS,   Endo/Other  negative endocrine ROS  Renal/GU negative Renal ROS     Musculoskeletal   Abdominal Normal abdominal exam  (+)   Peds  Hematology negative hematology ROS (+)   Anesthesia Other Findings   Reproductive/Obstetrics (+) Pregnancy                             Anesthesia Physical Anesthesia Plan  ASA: II  Anesthesia Plan: Epidural   Post-op Pain Management:    Induction:   Airway Management Planned:   Additional Equipment:   Intra-op Plan:   Post-operative Plan:   Informed Consent: I have reviewed the patients History and Physical, chart, labs and discussed the procedure including the risks, benefits and alternatives for the proposed anesthesia with the patient or authorized representative who has indicated his/her understanding and acceptance.     Plan Discussed with:   Anesthesia Plan Comments:         Anesthesia Quick Evaluation

## 2014-10-20 LAB — CBC
HCT: 29.5 % — ABNORMAL LOW (ref 36.0–46.0)
Hemoglobin: 10.4 g/dL — ABNORMAL LOW (ref 12.0–15.0)
MCH: 32.2 pg (ref 26.0–34.0)
MCHC: 35.3 g/dL (ref 30.0–36.0)
MCV: 91.3 fL (ref 78.0–100.0)
Platelets: 179 10*3/uL (ref 150–400)
RBC: 3.23 MIL/uL — ABNORMAL LOW (ref 3.87–5.11)
RDW: 13 % (ref 11.5–15.5)
WBC: 14.5 10*3/uL — ABNORMAL HIGH (ref 4.0–10.5)

## 2014-10-20 NOTE — Lactation Note (Signed)
This note was copied from the chart of Teresa Chayil Bethea. Lactation Consultation Note; Initial visit with mom. Baby asleep on mom's chest She reports that baby has latched on well 4 times since delivery. Reports baby has been spitty. Reviewed normal newborn behavior the first 24 hours. Reviewed feeding cues and encouraged to feed whenever she sees them. Discussed cluster feeding and the second night,. BF brochure given- reviewed BFS[facility] and OP appointments as resources for support after DC. Asking about pumping and going back to work- reviewed milk storage and pumping. No further questions at present. To call for assist prn  Patient Name: Teresa Bell Date: 10/20/2014 Reason for consult: Initial assessment   Maternal Data Formula Feeding for Exclusion: No Does the patient have breastfeeding experience prior to this delivery?: No  Feeding   LATCH Score/Interventions                      Lactation Tools Discussed/Used     Consult Status Consult Status: Follow-up Date: 10/21/14 Follow-up type: In-patient    Truddie Crumble 10/20/2014, 12:19 PM

## 2014-10-20 NOTE — Anesthesia Postprocedure Evaluation (Signed)
  Anesthesia Post-op Note  Patient: Teresa Bell  Procedure(s) Performed: * No procedures listed *  Patient Location: Mother/Baby  Anesthesia Type:Epidural  Level of Consciousness: awake, alert , oriented and patient cooperative  Airway and Oxygen Therapy: Patient Spontanous Breathing  Post-op Pain: none  Post-op Assessment: Post-op Vital signs reviewed, Patient's Cardiovascular Status Stable, Respiratory Function Stable, Patent Airway, No headache, No backache, No residual numbness and No residual motor weakness  Post-op Vital Signs: Reviewed and stable  Last Vitals:  Filed Vitals:   10/20/14 0700  BP: 103/59  Pulse: 57  Temp: 36.6 C  Resp: 18    Complications: No apparent anesthesia complications

## 2014-10-21 MED ORDER — IBUPROFEN 600 MG PO TABS: 600.0000 mg | ORAL_TABLET | Freq: Four times a day (QID) | ORAL | Status: DC

## 2014-10-21 NOTE — Discharge Summary (Signed)
Obstetric Discharge Summary Reason for Admission: induction of labor Prenatal Procedures: ultrasound Intrapartum Procedures: spontaneous vaginal delivery Postpartum Procedures: none Complications-Operative and Postpartum: 2nd degree perineal laceration HEMOGLOBIN  Date Value Ref Range Status  10/20/2014 10.4* 12.0 - 15.0 g/dL Final   HCT  Date Value Ref Range Status  10/20/2014 29.5* 36.0 - 46.0 % Final    Physical Exam:  General: alert, cooperative and no distress Lochia: appropriate Uterine Fundus: firm, midline, U-2 Perineum: 2nd degree repair healing well, no significant drainage, no dehiscence, no significant erythema DVT Evaluation: No evidence of DVT seen on physical exam. Negative Homan's sign. No cords or calf tenderness. No significant calf/ankle edema.  Discharge Diagnoses: Term Pregnancy-delivered  Discharge Information: Date: 10/21/2014 Activity: pelvic rest Diet: routine Medications: PNV and Ibuprofen Condition: stable Instructions: refer to practice specific booklet Discharge to: home Follow-up Information    Follow up with LAVOIE,MARIE-LYNE, MD. Schedule an appointment as soon as possible for a visit in 6 weeks.   Specialty:  Obstetrics and Gynecology   Why:  postpartum visit   Contact information:   [place] [address] [phone]       Newborn Data: Live born female on 10/19/2014 Birth Weight: 7 lb 15.5 oz (3615 g) APGAR: 8, 9  Home with mother.  Laury Deep, M MSN, CNM 10/21/2014, 11:00 AM

## 2014-10-21 NOTE — Lactation Note (Signed)
This note was copied from the chart of Teresa Rosebud Garverick. Lactation Consultation Note  Follow up visit made prior to discharge.  Mom is currently breastfeeding baby using football hold.  Baby is actively sucking/swallowing.  Breasts are soft.  Milk coming to volume discussed.  Instructed on continuing feeding on cue,  using good breast massage during feeding, keeping feeding diaries and engorgement treatment.  Mom has a DEBP at home.  Outpatient services and support encouraged prn.  Patient Name: Teresa Bell [id number] Date: 10/21/2014 Reason for consult: Follow-up assessment   Maternal Data    Feeding Feeding Type: Breast Fed Length of feed: 30 min  LATCH Score/Interventions Latch: Grasps breast easily, tongue down, lips flanged, rhythmical sucking.  Audible Swallowing: Spontaneous and intermittent  Type of Nipple: Everted at rest and after stimulation  Comfort (Breast/Nipple): Soft / non-tender     Hold (Positioning): No assistance needed to correctly position infant at breast.  LATCH Score: 10  Lactation Tools Discussed/Used     Consult Status Consult Status: Complete    Ave Filter 10/21/2014, 10:06 AM

## 2014-10-21 NOTE — Discharge Instructions (Signed)
Breast Pumping Tips °If you are breastfeeding, there may be times when you cannot feed your baby directly. Returning to work or going on a trip are common examples. Pumping allows you to store breast milk and feed it to your baby later.  °You may not get much milk when you first start to pump. Your breasts should start to make more after a few days. If you pump at the times you usually feed your baby, you may be able to keep making enough milk to feed your baby without also using formula. The more often you pump, the more milk you will produce.  °WHEN SHOULD I PUMP?  °· You can begin to pump soon after delivery. However, some experts recommend waiting about 4 weeks before giving your infant a bottle to make sure breastfeeding is going well.  °· If you plan to return to work, begin pumping a few weeks before. This will help you develop techniques that work best for you. It also lets you build up a supply of breast milk.   °· When you are with your infant, feed on demand and pump after each feeding.   °· When you are away from your infant for several hours, pump for about 15 minutes every 2-3 hours. Pump both breasts at the same time if you can.   °· If your infant has a formula feeding, make sure to pump around the same time.     °· If you drink any alcohol, wait 2 hours before pumping.   °HOW DO I PREPARE TO PUMP? °Your let-down reflex is the natural reaction to stimulation that makes your breast milk flow. It is easier to stimulate this reflex when you are relaxed. Find relaxation techniques that work for you. If you have difficulty with your let-down reflex, try these methods:  °· Smell one of your infant's blankets or an item of clothing.   °· Look at a picture or video of your infant.   °· Sit in a quiet, private space.   °· Massage the breast you plan to pump.   °· Place soothing warmth on the breast.   °· Play relaxing music.   °WHAT ARE SOME GENERAL BREAST PUMPING TIPS? °· Wash your hands before you pump. You  do not need to wash your nipples or breasts. °· There are three ways to pump. °¨ You can use your hand to massage and compress your breast. °¨ You can use a handheld manual pump. °¨ You can use an electric pump.   °· Make sure the suction cup (flange) on the breast pump is the right size. Place the flange directly over the nipple. If it is the wrong size or placed the wrong way, it may be painful and cause nipple damage.   °· If pumping is uncomfortable, apply a small amount of purified or modified lanolin to your nipple and areola. °· If you are using an electric pump, adjust the speed and suction power to be more comfortable. °· If pumping is painful or if you are not getting very much milk, you may need a different type of pump. A lactation consultant can help you determine what type of pump to use.   °· Keep a full water bottle near you at all times. Drinking lots of fluid helps you make more milk.  °· You can store your milk to use later. Pumped breast milk can be stored in a sealable, sterile container or plastic bag. Label all stored breast milk with the date you pumped it. °¨ Milk can stay out at room temperature for up to 8 hours. °¨   You can store your milk in the refrigerator for up to 8 days. °¨ You can store your milk in the freezer for 3 months. Thaw frozen milk using warm water. Do not put it in the microwave. °· Do not smoke. Smoking can lower your milk supply and harm your infant. If you need help quitting, ask your health care provider to recommend a program.   °WHEN SHOULD I CALL MY HEALTH CARE PROVIDER OR A LACTATION CONSULTANT? °· You are having trouble pumping. °· You are concerned that you are not making enough milk. °· You have nipple pain, soreness, or redness. °· You want to use birth control. Birth control pills may lower your milk supply. Talk to your health care provider about your options. °Document Released: 11/06/2009 Document Revised: 05/24/2013 Document Reviewed:  03/11/2013 °[employer]® Patient Information ©2015 [employer]. This information is not intended to replace advice given to you by your health care provider. Make sure you discuss any questions you have with your health care provider. ° °Nutrition for the New Mother  °A new mother needs good health and nutrition so she can have energy to take care of a new baby. Whether a mother breastfeeds or formula feeds the baby, it is important to have a well-balanced diet. Foods from all the food groups should be chosen to meet the new mother's energy needs and to give her the nutrients needed for repair and healing.  °A HEALTHY EATING PLAN °The My Pyramid plan for Moms outlines what you should eat to help you and your baby stay healthy. The energy and amount of food you need depends on whether or not you are breastfeeding. If you are breastfeeding you will need more nutrients. If you choose not to breastfeed, your nutrition goal should be to return to a healthy weight. Limiting calories may be needed if you are not breastfeeding.  °HOME CARE INSTRUCTIONS  °· For a personal plan based on your unique needs, see your Registered Dietitian or visit www.mypyramid.gov. °· Eat a variety of foods. The plan below will help guide you. The following chart has a suggested daily meal plan from the My Pyramid for Moms. °· Eat a variety of fruits and vegetables. °· Eat more dark green and orange vegetables and cooked dried beans. °· Make half your grains whole grains. Choose whole instead of refined grains. °· Choose low-fat or lean meats and poultry. °· Choose low-fat or fat-free dairy products like milk, cheese, or yogurt. °Fruits °· Breastfeeding: 2 cups °· Non-Breastfeeding: 2 cups °· What Counts as a serving? °¨ 1 cup of fruit or juice. °¨ ½ cup dried fruit. °Vegetables °· Breastfeeding: 3 cups °· Non-Breastfeeding: 2 ½ cups °· What Counts as a serving? °¨ 1 cup raw or cooked vegetables. °¨ Juice or 2 cups raw leafy  vegetables. °Grains °· Breastfeeding: 8 oz °· Non-Breastfeeding: 6 oz °· What Counts as a serving? °¨ 1 slice bread. °¨ 1 oz ready-to-eat cereal. °¨ ½ cup cooked pasta, rice, or cereal. °Meat and Beans °· Breastfeeding: 6 ½ oz °· Non-Breastfeeding: 5 ½ oz °· What Counts as a serving? °¨ 1 oz lean meat, poultry, or fish °¨ ¼ cup cooked dry beans °¨ ½ oz nuts or 1 egg °¨ 1 tbs peanut butter °Milk °· Breastfeeding: 3 cups °· Non-Breastfeeding: 3 cups °· What Counts as a serving? °¨ 1 cup milk. °¨ 8 oz yogurt. °¨ 1 ½ oz cheese. °¨ 2 oz processed cheese. °TIPS FOR THE BREASTFEEDING MOM °· Rapid weight   loss is not suggested when you are breastfeeding. By simply breastfeeding, you will be able to lose the weight gained during your pregnancy. Your caregiver can keep track of your weight and tell you if your weight loss is appropriate.  Be sure to drink fluids. You may notice that you are thirstier than usual. A suggestion is to drink a glass of water or other beverage whenever you breastfeed.  Avoid alcohol as it can be passed into your breast milk.  Limit caffeine drinks to no more than 2 to 3 cups per day.  You may need to keep taking your prenatal vitamin while you are breastfeeding. Talk with your caregiver about taking a vitamin or supplement. RETURING TO A HEALTHY WEIGHT  The My Pyramid Plan for Moms will help you return to a healthy weight. It will also provide the nutrients you need.  You may need to limit "empty" calories. These include:  High fat foods like fried foods, fatty meats, fast food, butter, and mayonnaise.  High sugar foods like sodas, jelly, candy, and sweets.  Be physically active. Include 30 minutes of exercise or more each day. Choose an activity you like such as walking, swimming, biking, or aerobics. Check with your caregiver before you start to exercise. Document Released: 08/26/2007 Document Revised: 08/11/2011 Document Reviewed: 08/26/2007 [facility] Patient Information  2015 [place], [place]. This information is not intended to replace advice given to you by your health care provider. Make sure you discuss any questions you have with your health care provider. Postpartum Depression and Baby Blues The postpartum period begins right after the birth of a baby. During this time, there is often a great amount of joy and excitement. It is also a time of many changes in the life of the parents. Regardless of how many times a mother gives birth, each child brings new challenges and dynamics to the family. It is not unusual to have feelings of excitement along with confusing shifts in moods, emotions, and thoughts. All mothers are at risk of developing postpartum depression or the "baby blues." These mood changes can occur right after giving birth, or they may occur many months after giving birth. The baby blues or postpartum depression can be mild or severe. Additionally, postpartum depression can go away rather quickly, or it can be a long-term condition.  CAUSES Raised hormone levels and the rapid drop in those levels are thought to be a main cause of postpartum depression and the baby blues. A number of hormones change during and after pregnancy. Estrogen and progesterone usually decrease right after the delivery of your baby. The levels of thyroid hormone and various cortisol steroids also rapidly drop. Other factors that play a role in these mood changes include major life events and genetics.  RISK FACTORS If you have any of the following risks for the baby blues or postpartum depression, know what symptoms to watch out for during the postpartum period. Risk factors that may increase the likelihood of getting the baby blues or postpartum depression include:  Having a personal or family history of depression.   Having depression while being pregnant.   Having premenstrual mood issues or mood issues related to oral contraceptives.  Having a lot of life stress.   Having  marital conflict.   Lacking a social support network.   Having a baby with special needs.   Having health problems, such as diabetes.  SIGNS AND SYMPTOMS Symptoms of baby blues include:  Brief changes in mood, such as going  from extreme happiness to sadness.  Decreased concentration.   Difficulty sleeping.   Crying spells, tearfulness.   Irritability.   Anxiety.  Symptoms of postpartum depression typically begin within the first month after giving birth. These symptoms include:  Difficulty sleeping or excessive sleepiness.   Marked weight loss.   Agitation.   Feelings of worthlessness.   Lack of interest in activity or food.  Postpartum psychosis is a very serious condition and can be dangerous. Fortunately, it is rare. Displaying any of the following symptoms is cause for immediate medical attention. Symptoms of postpartum psychosis include:   Hallucinations and delusions.   Bizarre or disorganized behavior.   Confusion or disorientation.  DIAGNOSIS  A diagnosis is made by an evaluation of your symptoms. There are no medical or lab tests that lead to a diagnosis, but there are various questionnaires that a health care provider may use to identify those with the baby blues, postpartum depression, or psychosis. Often, a screening tool called the [place] Postnatal Depression Scale is used to diagnose depression in the postpartum period.  TREATMENT The baby blues usually goes away on its own in 1-2 weeks. Social support is often all that is needed. You will be encouraged to get adequate sleep and rest. Occasionally, you may be given medicines to help you sleep.  Postpartum depression requires treatment because it can last several months or longer if it is not treated. Treatment may include individual or group therapy, medicine, or both to address any social, physiological, and psychological factors that may play a role in the depression. Regular exercise, a  healthy diet, rest, and social support may also be strongly recommended.  Postpartum psychosis is more serious and needs treatment right away. Hospitalization is often needed. HOME CARE INSTRUCTIONS  Get as much rest as you can. Nap when the baby sleeps.   Exercise regularly. Some women find yoga and walking to be beneficial.   Eat a balanced and nourishing diet.   Do little things that you enjoy. Have a cup of tea, take a bubble bath, read your favorite magazine, or listen to your favorite music.  Avoid alcohol.   Ask for help with household chores, cooking, grocery shopping, or running errands as needed. Do not try to do everything.   Talk to people close to you about how you are feeling. Get support from your partner, family members, friends, or other new moms.  Try to stay positive in how you think. Think about the things you are grateful for.   Do not spend a lot of time alone.   Only take over-the-counter or prescription medicine as directed by your health care provider.  Keep all your postpartum appointments.   Let your health care provider know if you have any concerns.  SEEK MEDICAL CARE IF: You are having a reaction to or problems with your medicine. SEEK IMMEDIATE MEDICAL CARE IF:  You have suicidal feelings.   You think you may harm the baby or someone else. MAKE SURE YOU:  Understand these instructions.  Will watch your condition.  Will get help right away if you are not doing well or get worse. Document Released: 02/21/2004 Document Revised: 05/24/2013 Document Reviewed: 02/28/2013 [facility] Patient Information 2015 [place], [place]. This information is not intended to replace advice given to you by your health care provider. Make sure you discuss any questions you have with your health care provider. Breastfeeding and Mastitis Mastitis is inflammation of the breast tissue. It can occur in women who  are breastfeeding. This can make breastfeeding  painful. Mastitis will sometimes go away on its own. Your health care provider will help determine if treatment is needed. CAUSES Mastitis is often associated with a blocked milk (lactiferous) duct. This can happen when too much milk builds up in the breast. Causes of excess milk in the breast can include:  Poor latch-on. If your baby is not latched onto the breast properly, she or he may not empty your breast completely while breastfeeding.  Allowing too much time to pass between feedings.  Wearing a bra or other clothing that is too tight. This puts extra pressure on the lactiferous ducts so milk does not flow through them as it should. Mastitis can also be caused by a bacterial infection. Bacteria may enter the breast tissue through cuts or openings in the skin. In women who are breastfeeding, this may occur because of cracked or irritated skin. Cracks in the skin are often caused when your baby does not latch on properly to the breast. SIGNS AND SYMPTOMS  Swelling, redness, tenderness, and pain in an area of the breast.  Swelling of the glands under the arm on the same side.  Fever may or may not accompany mastitis. If an infection is allowed to progress, a collection of pus (abscess) may develop. DIAGNOSIS  Your health care provider can usually diagnose mastitis based on your symptoms and a physical exam. Tests may be done to help confirm the diagnosis. These may include:  Removal of pus from the breast by applying pressure to the area. This pus can be examined in the lab to determine which bacteria are present. If an abscess has developed, the fluid in the abscess can be removed with a needle. This can also be used to confirm the diagnosis and determine the bacteria present. In most cases, pus will not be present.  Blood tests to determine if your body is fighting a bacterial infection.  Mammogram or ultrasound tests to rule out other problems or diseases. TREATMENT  Mastitis that  occurs with breastfeeding will sometimes go away on its own. Your health care provider may choose to wait 24 hours after first seeing you to decide whether a prescription medicine is needed. If your symptoms are worse after 24 hours, your health care provider will likely prescribe an antibiotic medicine to treat the mastitis. He or she will determine which bacteria are most likely causing the infection and will then select an appropriate antibiotic medicine. This is sometimes changed based on the results of tests performed to identify the bacteria, or if there is no response to the antibiotic medicine selected. Antibiotic medicines are usually given by mouth. You may also be given medicine for pain. HOME CARE INSTRUCTIONS  Only take over-the-counter or prescription medicines for pain, fever, or discomfort as directed by your health care provider.  If your health care provider prescribed an antibiotic medicine, take the medicine as directed. Make sure you finish it even if you start to feel better.  Do not wear a tight or underwire bra. Wear a soft, supportive bra.  Increase your fluid intake, especially if you have a fever.  Continue to empty the breast. Your health care provider can tell you whether this milk is safe for your infant or needs to be thrown out. You may be told to stop nursing until your health care provider thinks it is safe for your baby. Use a breast pump if you are advised to stop nursing.  Keep your nipples  clean and dry.  Empty the first breast completely before going to the other breast. If your baby is not emptying your breasts completely for some reason, use a breast pump to empty your breasts.  If you go back to work, pump your breasts while at work to stay in time with your nursing schedule.  Avoid allowing your breasts to become overly filled with milk (engorged). SEEK MEDICAL CARE IF:  You have pus-like discharge from the breast.  Your symptoms do not improve with  the treatment prescribed by your health care provider within 2 days. SEEK IMMEDIATE MEDICAL CARE IF:  Your pain and swelling are getting worse.  You have pain that is not controlled with medicine.  You have a red line extending from the breast toward your armpit.  You have a fever or persistent symptoms for more than 2-3 days.  You have a fever and your symptoms suddenly get worse. MAKE SURE YOU:   Understand these instructions.  Will watch your condition.  Will get help right away if you are not doing well or get worse. Document Released: 09/13/2004 Document Revised: 05/24/2013 Document Reviewed: 12/23/2012 [facility] Patient Information 2015 [place], [place]. This information is not intended to replace advice given to you by your health care provider. Make sure you discuss any questions you have with your health care provider. Breastfeeding Deciding to breastfeed is one of the best choices you can make for you and your baby. A change in hormones during pregnancy causes your breast tissue to grow and increases the number and size of your milk ducts. These hormones also allow proteins, sugars, and fats from your blood supply to make breast milk in your milk-producing glands. Hormones prevent breast milk from being released before your baby is born as well as prompt milk flow after birth. Once breastfeeding has begun, thoughts of your baby, as well as his or her sucking or crying, can stimulate the release of milk from your milk-producing glands.  BENEFITS OF BREASTFEEDING For Your Baby  Your first milk (colostrum) helps your baby's digestive system function better.   There are antibodies in your milk that help your baby fight off infections.   Your baby has a lower incidence of asthma, allergies, and sudden infant death syndrome.   The nutrients in breast milk are better for your baby than infant formulas and are designed uniquely for your baby's needs.   Breast milk improves your  baby's brain development.   Your baby is less likely to develop other conditions, such as childhood obesity, asthma, or type 2 diabetes mellitus.  For You   Breastfeeding helps to create a very special bond between you and your baby.   Breastfeeding is convenient. Breast milk is always available at the correct temperature and costs nothing.   Breastfeeding helps to burn calories and helps you lose the weight gained during pregnancy.   Breastfeeding makes your uterus contract to its prepregnancy size faster and slows bleeding (lochia) after you give birth.   Breastfeeding helps to lower your risk of developing type 2 diabetes mellitus, osteoporosis, and breast or ovarian cancer later in life. SIGNS THAT YOUR BABY IS HUNGRY Early Signs of Hunger  Increased alertness or activity.  Stretching.  Movement of the head from side to side.  Movement of the head and opening of the mouth when the corner of the mouth or cheek is stroked (rooting).  Increased sucking sounds, smacking lips, cooing, sighing, or squeaking.  Hand-to-mouth movements.  Increased sucking of  fingers or hands. Late Signs of Hunger  Fussing.  Intermittent crying. Extreme Signs of Hunger Signs of extreme hunger will require calming and consoling before your baby will be able to breastfeed successfully. Do not wait for the following signs of extreme hunger to occur before you initiate breastfeeding:   Restlessness.  A loud, strong cry.   Screaming. BREASTFEEDING BASICS Breastfeeding Initiation  Find a comfortable place to sit or lie down, with your neck and back well supported.  Place a pillow or rolled up blanket under your baby to bring him or her to the level of your breast (if you are seated). Nursing pillows are specially designed to help support your arms and your baby while you breastfeed.  Make sure that your baby's abdomen is facing your abdomen.   Gently massage your breast. With your  fingertips, massage from your chest wall toward your nipple in a circular motion. This encourages milk flow. You may need to continue this action during the feeding if your milk flows slowly.  Support your breast with 4 fingers underneath and your thumb above your nipple. Make sure your fingers are well away from your nipple and your baby's mouth.   Stroke your baby's lips gently with your finger or nipple.   When your baby's mouth is open wide enough, quickly bring your baby to your breast, placing your entire nipple and as much of the colored area around your nipple (areola) as possible into your baby's mouth.   More areola should be visible above your baby's upper lip than below the lower lip.   Your baby's tongue should be between his or her lower gum and your breast.   Ensure that your baby's mouth is correctly positioned around your nipple (latched). Your baby's lips should create a seal on your breast and be turned out (everted).  It is common for your baby to suck about 2-3 minutes in order to start the flow of breast milk. Latching Teaching your baby how to latch on to your breast properly is very important. An improper latch can cause nipple pain and decreased milk supply for you and poor weight gain in your baby. Also, if your baby is not latched onto your nipple properly, he or she may swallow some air during feeding. This can make your baby fussy. Burping your baby when you switch breasts during the feeding can help to get rid of the air. However, teaching your baby to latch on properly is still the best way to prevent fussiness from swallowing air while breastfeeding. Signs that your baby has successfully latched on to your nipple:    Silent tugging or silent sucking, without causing you pain.   Swallowing heard between every 3-4 sucks.    Muscle movement above and in front of his or her ears while sucking.  Signs that your baby has not successfully latched on to  nipple:   Sucking sounds or smacking sounds from your baby while breastfeeding.  Nipple pain. If you think your baby has not latched on correctly, slip your finger into the corner of your baby's mouth to break the suction and place it between your baby's gums. Attempt breastfeeding initiation again. Signs of Successful Breastfeeding Signs from your baby:   A gradual decrease in the number of sucks or complete cessation of sucking.   Falling asleep.   Relaxation of his or her body.   Retention of a small amount of milk in his or her mouth.   Letting go  of your breast by himself or herself. Signs from you:  Breasts that have increased in firmness, weight, and size 1-3 hours after feeding.   Breasts that are softer immediately after breastfeeding.  Increased milk volume, as well as a change in milk consistency and color by the fifth day of breastfeeding.   Nipples that are not sore, cracked, or bleeding. Signs That Your Randel Books is Getting Enough Milk  Wetting at least 3 diapers in a 24-hour period. The urine should be clear and pale yellow by age 37 days.  At least 3 stools in a 24-hour period by age 37 days. The stool should be soft and yellow.  At least 3 stools in a 24-hour period by age 623 days. The stool should be seedy and yellow.  No loss of weight greater than 10% of birth weight during the first 71 days of age.  Average weight gain of 4-7 ounces (113-198 g) per week after age 62 days.  Consistent daily weight gain by age 375 days, without weight loss after the age of 2 weeks. After a feeding, your baby may spit up a small amount. This is common. BREASTFEEDING FREQUENCY AND DURATION Frequent feeding will help you make more milk and can prevent sore nipples and breast engorgement. Breastfeed when you feel the need to reduce the fullness of your breasts or when your baby shows signs of hunger. This is called "breastfeeding on demand." Avoid introducing a pacifier to your  baby while you are working to establish breastfeeding (the first 4-6 weeks after your baby is born). After this time you may choose to use a pacifier. Research has shown that pacifier use during the first year of a baby's life decreases the risk of sudden infant death syndrome (SIDS). Allow your baby to feed on each breast as long as he or she wants. Breastfeed until your baby is finished feeding. When your baby unlatches or falls asleep while feeding from the first breast, offer the second breast. Because newborns are often sleepy in the first few weeks of life, you may need to awaken your baby to get him or her to feed. Breastfeeding times will vary from baby to baby. However, the following rules can serve as a guide to help you ensure that your baby is properly fed:  Newborns (babies 56 weeks of age or younger) may breastfeed every 1-3 hours.  Newborns should not go longer than 3 hours during the day or 5 hours during the night without breastfeeding.  You should breastfeed your baby a minimum of 8 times in a 24-hour period until you begin to introduce solid foods to your baby at around 67 months of age. BREAST MILK PUMPING Pumping and storing breast milk allows you to ensure that your baby is exclusively fed your breast milk, even at times when you are unable to breastfeed. This is especially important if you are going back to work while you are still breastfeeding or when you are not able to be present during feedings. Your lactation consultant can give you guidelines on how long it is safe to store breast milk.  A breast pump is a machine that allows you to pump milk from your breast into a sterile bottle. The pumped breast milk can then be stored in a refrigerator or freezer. Some breast pumps are operated by hand, while others use electricity. Ask your lactation consultant which type will work best for you. Breast pumps can be purchased, but some hospitals and breastfeeding support groups  lease  breast pumps on a monthly basis. A lactation consultant can teach you how to hand express breast milk, if you prefer not to use a pump.  CARING FOR YOUR BREASTS WHILE YOU BREASTFEED Nipples can become dry, cracked, and sore while breastfeeding. The following recommendations can help keep your breasts moisturized and healthy:  Avoid using soap on your nipples.   Wear a supportive bra. Although not required, special nursing bras and tank tops are designed to allow access to your breasts for breastfeeding without taking off your entire bra or top. Avoid wearing underwire-style bras or extremely tight bras.  Air dry your nipples for 3-43minutes after each feeding.   Use only cotton bra pads to absorb leaked breast milk. Leaking of breast milk between feedings is normal.   Use lanolin on your nipples after breastfeeding. Lanolin helps to maintain your skin's normal moisture barrier. If you use pure lanolin, you do not need to wash it off before feeding your baby again. Pure lanolin is not toxic to your baby. You may also hand express a few drops of breast milk and gently massage that milk into your nipples and allow the milk to air dry. In the first few weeks after giving birth, some women experience extremely full breasts (engorgement). Engorgement can make your breasts feel heavy, warm, and tender to the touch. Engorgement peaks within 3-5 days after you give birth. The following recommendations can help ease engorgement:  Completely empty your breasts while breastfeeding or pumping. You may want to start by applying warm, moist heat (in the shower or with warm water-soaked hand towels) just before feeding or pumping. This increases circulation and helps the milk flow. If your baby does not completely empty your breasts while breastfeeding, pump any extra milk after he or she is finished.  Wear a snug bra (nursing or regular) or tank top for 1-2 days to signal your body to slightly decrease milk  production.  Apply ice packs to your breasts, unless this is too uncomfortable for you.  Make sure that your baby is latched on and positioned properly while breastfeeding. If engorgement persists after 48 hours of following these recommendations, contact your health care provider or a Science writer. OVERALL HEALTH CARE RECOMMENDATIONS WHILE BREASTFEEDING  Eat healthy foods. Alternate between meals and snacks, eating 3 of each per day. Because what you eat affects your breast milk, some of the foods may make your baby more irritable than usual. Avoid eating these foods if you are sure that they are negatively affecting your baby.  Drink milk, fruit juice, and water to satisfy your thirst (about 10 glasses a day).   Rest often, relax, and continue to take your prenatal vitamins to prevent fatigue, stress, and anemia.  Continue breast self-awareness checks.  Avoid chewing and smoking tobacco.  Avoid alcohol and drug use. Some medicines that may be harmful to your baby can pass through breast milk. It is important to ask your health care provider before taking any medicine, including all over-the-counter and prescription medicine as well as vitamin and herbal supplements. It is possible to become pregnant while breastfeeding. If birth control is desired, ask your health care provider about options that will be safe for your baby. SEEK MEDICAL CARE IF:   You feel like you want to stop breastfeeding or have become frustrated with breastfeeding.  You have painful breasts or nipples.  Your nipples are cracked or bleeding.  Your breasts are red, tender, or warm.  You have  a swollen area on either breast.  You have a fever or chills.  You have nausea or vomiting.  You have drainage other than breast milk from your nipples.  Your breasts do not become full before feedings by the fifth day after you give birth.  You feel sad and depressed.  Your baby is too sleepy to eat  well.  Your baby is having trouble sleeping.   Your baby is wetting less than 3 diapers in a 24-hour period.  Your baby has less than 3 stools in a 24-hour period.  Your baby's skin or the white part of his or her eyes becomes yellow.   Your baby is not gaining weight by 58 days of age. SEEK IMMEDIATE MEDICAL CARE IF:   Your baby is overly tired (lethargic) and does not want to wake up and feed.  Your baby develops an unexplained fever. Document Released: 05/19/2005 Document Revised: 05/24/2013 Document Reviewed: 11/10/2012 [facility] Patient Information 2015 [place], [place]. This information is not intended to replace advice given to you by your health care provider. Make sure you discuss any questions you have with your health care provider.

## 2014-10-21 NOTE — Progress Notes (Signed)
Patient ID: Teresa Bell, female   DOB: May 10, 1986, 29 y.o.   MRN: [id number] Post Partum Day #2            Information for the patient's newborn:  Josalin, Carneiro Girl Laterria [[id number]]  female  Feeding: breast  Subjective: No HA, SOB, CP, F/C, breast symptoms. Pain well-controlled with ibuprofen. Normal vaginal bleeding, no clots.      Objective:  Temp:  [97.8 F (36.6 C)-98 F (36.7 C)] 97.8 F (36.6 C) (05/21 0602) Pulse Rate:  [57-65] 57 (05/21 0602) Resp:  [16-18] 16 (05/21 0602) BP: (94-101)/(52-65) 94/52 mmHg (05/21 0602)       Recent Labs  10/19/14 0800 10/20/14 0528  WBC 10.1 14.5*  HGB 11.5* 10.4*  HCT 32.2* 29.5*  PLT 164 179    Blood type: --/--/O POS (05/19 0800) Rubella: Immune (10/16 0000)    Physical Exam:  General: alert, cooperative and no distress Uterine Fundus: firm, midline, U-2 Lochia: appropriate Perineum: 2nd degree repair healing well, edema none DVT Evaluation: No evidence of DVT seen on physical exam. Negative Homan's sign. No cords or calf tenderness. No significant calf/ankle edema.    Assessment/Plan: PPD # 2 / 29 y.o., G1P1001 S/P: induced vaginal   Principal Problem:   Postpartum care following vaginal delivery (5/19)   normal postpartum exam  Continue current postpartum care  D/C home   LOS: 2 days   Laury Deep, M, MSN, CNM 10/21/2014, 9:24 AM

## 2014-10-26 ENCOUNTER — Ambulatory Visit (HOSPITAL_COMMUNITY)
Admission: RE | Admit: 2014-10-26 | Discharge: 2014-10-26 | Disposition: A | Discharge status: Home / Self Care | Payer: 59 | Source: Ambulatory Visit | Attending: Obstetrics & Gynecology | Admitting: Obstetrics & Gynecology

## 2014-10-26 ENCOUNTER — Ambulatory Visit (HOSPITAL_COMMUNITY): Admission: RE | Admit: 2014-10-26 | Payer: 59 | Source: Ambulatory Visit

## 2014-10-26 NOTE — Lactation Note (Signed)
Lactation Consult  Mother's reason for visit:  Mother phoned [name] office with concerns about breastfeeding and pumping. Visit Type:  Feeding assessment Appointment Notes: Mother states that Peds Dr recommended that if she was concerned about her milk supply to post pump and she how much she gets.  Mother has a history of infertility. She states that she has PCOS. She also states that it took 5 years of trying to become pregnant. Mother states she declined to take any fertility drugs during this time. Mother states that she had some breast changes during pregnancy . She states she did go up a bra size.  Mothers breast are firm and full. Her nipple tissue is intact. Observed only a slight scab on the left nipple.  Infant was 7-15 at birth, she was discharged on day 2 of life and was at 9 % weight loss (7-6). On day 5 infant had lost another 2 ounces with weight of 7-3 at [facility] office. Today infant is 65 days old, she has had another loss of 2-3 ounces. She is 7-0. Mother states that infant makes a clicking sound when she is feeding.   Mother states that on day 4 she felt her breast become heavy and full. She also states this is when she saw changes in infants stool color from green to yellow. Consult:  Initial Lactation Consultant:  Darla Lesches  ________________________________________________________________________ Teresa Bell Name: Teresa Bell Date of Birth: 10/19/2015 Pediatrician: Dr Laurice Record Gender: female Gestational Age: [redacted]w[redacted]d (At Birth) Birth Weight: 7 lb 15.5 oz (3615 g) Weight at Discharge: Weight: 7 lb 6 oz (3345 g)Date of Discharge: 10/21/2014 Filed Weights   10/19/14 1743 10/20/14 0019 10/20/14 2351  Weight: 7 lb 15.5 oz (3615 g) 7 lb 13 oz (3545 g) 7 lb 6 oz (3345 g)   Weight today: 7-0.1, 1638      ________________________________________________________________________  Mother's Name: Teresa Bell Type of delivery:  vaginal  delivery Breastfeeding Experience:  none Maternal Medical Conditions:  Thyroid, Polycystic ovarian syndrome, Infertility and mother states no fertility durgs but tried to get pregnant for 5 years Maternal Medications:  Prenatal vits, Tirosint 112 mcg  ________________________________________________________________________  Breastfeeding History (Post Discharge)  Frequency of breastfeeding:  Every 2-3 hours Duration of feeding:  15-30 mins    Pumping  Type of pump:  Medela pump in style Frequency:  Pumped x 2 Volume: a few  drops  Infant Intake and Output Assessment  Voids:  6 yesterday and 4 today 24 hrs.  Color:  Clear yellow Stools:  2-3,24 hrs.  Color:  Green and Yellow, observed large greenish yellow stool   ________________________________________________________________________  Maternal Breast Assessment  Breast:  Full Nipple:  Erect Pain level:  0 Pain interventions:  Expressed breast milk  _______________________________________________________________________ Feeding Assessment/Evaluation;  Observed that infant is still slightly jaundice. Mother states that when Peds saw her on Monday a heal stick was done and Peds states  ok. Mother states that infants eyes were yellow but she notices that it is much better. Observed that infant is jaundice on face, trunk and upper extremities .   Infant latched with lips pursed , top lip rolled under and bottom lip tucked in . Assist mother adjusting infants jaw for wider gape. Mother was taught breast compression. Infant sustained good depth without adjusting lips again. Infant transferred 18 ml. Infant placed on alternate breast in cross cradle hold. Observed good wide depth with good burst of suckling. Another 16 ml transferred.  Infant's oral assessment:  Variance, slight tightness , possible posterior frenula  Positioning:  Football Right breast  LATCH documentation:  Latch:  2 = Grasps breast easily, tongue down,  lips flanged, rhythmical sucking.  Audible swallowing:  2 = Spontaneous and intermittent  Type of nipple:  2 = Everted at rest and after stimulation  Comfort (Breast/Nipple):  1 = Filling, red/small blisters or bruises, mild/mod discomfort  Hold (Positioning):  1 = Assistance needed to correctly position infant at breast and maintain latch  LATCH score:  8  Attached assessment:  Deep  Lips flanged:  No.  Lips untucked:  Yes.    Suck assessment:  Nutritive  Pre-feed weight:3194, 7-0.1 Post-feed weight; 3212, 7-1.3 Amount transferred:  18 ml   Positioning:  Cross cradle Left breast  LATCH documentation:  Latch:  2 = Grasps breast easily, tongue down, lips flanged, rhythmical sucking.  Audible swallowing:  2 = Spontaneous and intermittent  Type of nipple:  2 = Everted at rest and after stimulation  Comfort (Breast/Nipple):  1 = Filling, red/small blisters or bruises, mild/mod discomfort  Hold (Positioning):  1 = Assistance needed to correctly position infant at breast and maintain latch  LATCH score:  8  Attached assessment:  Deep  Lips flanged:  Yes.    Lips untucked:  Yes.    Suck assessment:  Nutritive   Pre-feed weight:  3212, Post-feed weight:  3228 Amount transferred: 16  ml   Total amount pumped post feed:  R 2-3 ml    L 2-3 ml, Mother sat up with her own [name]. Mother was able to hand express several more ml's. She was fit with #27 flanges.  Infant was sleeping , swaddled in fathers arms. Mother to supplement after the next feeding.   Total amount transferred:  34 ml  Mother to continue to feed infant 8-12 times in 24 hours and with feeding cues Mother to use tips to adjust infant latch for wider gape/ work on depth/ use breast compression Advised parents of need to increase infants feedings to every 2-3 hours and wake infant is not cuing Post pump for 15-20 mins after every other feeding and supplement infant with EBM Mother advised in using a  curved tip syringe, spoon or cup to supplement Mother to do good breast massage and hand express 5-6 times daily Follow up [facility] services for weight check and feeding assessment on June 1 Peds office is closed on Monday per mother. appt on June 8

## 2014-11-01 ENCOUNTER — Ambulatory Visit (HOSPITAL_COMMUNITY)
Admission: RE | Admit: 2014-11-01 | Discharge: 2014-11-01 | Disposition: A | Discharge status: Home / Self Care | Payer: 59 | Source: Ambulatory Visit | Attending: Obstetrics & Gynecology | Admitting: Obstetrics & Gynecology

## 2014-11-01 NOTE — Lactation Note (Signed)
Lactation Consult  Mother's reason for visit: Followup  Visit Type:  F/U feeding assessment  Appointment Notes:  Feeding assessment confirmed  Consult:  Follow-Up Lactation Consultant:  Myer Haff  ________________________________________________________________________ [name] Name: Teresa Bell Date of Birth: 10/19/2015 Pediatrician: Suezanne Cheshire -  Gender: female Gestational Age: [redacted]w[redacted]d (At Birth) Birth Weight: 7 lb 15.5 oz (3615 g) Weight at Discharge: Weight: 7 lb 6 oz (3345 g)Date of Discharge: 10/21/2014 Filed Weights   10/19/14 1743 10/20/14 0019 10/20/14 2351  Weight: 7 lb 15.5 oz (3615 g) 7 lb 13 oz (3545 g) 7 lb 6 oz (3345 g)   Last weight taken from location outside of [facility]: 5/26 - 7-0 oz  Location:Pediatrician's office Weight today:7-3.0 oz 3260 g       ________________________________________________________________________  Mother's Name: Teresa Bell Type of delivery:   Breastfeeding Experience:  1st baby - per mom stressful  Maternal Medical Conditions:  PCOS, infertility  Maternal Medications:  PNV   ________________________________________________________________________  Breastfeeding History (Post Discharge)  Frequency of breastfeeding:  Every 2-3 hours - and cluster feeding especially at night  Duration of feeding:  15 - 20 mins , and some times not settled and has to re-latch   Supplementing: per mom has not supplemented at home   Pumping: [place] - per mom since last weeks consult has tried to pump and  without any results , per mom wondering why she can't pump any milk off    Infant Intake and Output Assessment  Voids:  7-10  in 24 hrs.  Color:  Clear yellow Stools:  2-3  in 24 hrs.  Color:  Yellow  ________________________________________________________________________  Maternal Breast Assessment  Breast:  Soft and Full Nipple:  Erect ( small in side , and areola semi  compressible  Pain level:  0 Pain interventions:  Expressed breast milk  _______________________________________________________________________ Feeding Assessment/Evaluation  Initial feeding assessment: per mom baby last fed at 1215 for 30 mins at the breast   Infant's oral assessment:  Variance - short labial frenulum noted above the gum line ,   Positioning:  Cross cradle Right breast  LATCH documentation:  Latch:  2 = Grasps breast easily, tongue down, lips flanged, rhythmical sucking.  Audible swallowing:  2 = Spontaneous and intermittent  Type of nipple:  2 = Everted at rest and after stimulation  Comfort (Breast/Nipple):  1 = Filling, red/small blisters or bruises, mild/mod discomfort to soft   Hold (Positioning):  1 = Assistance needed to correctly position infant at breast and maintain latch  LATCH score: 8   Attached assessment:  Shallow @ 1st , with breast compressions able to obtain the depth   Lips flanged:  No. flipped upper lip to flanged position  Lips untucked:  Yes.    Suck assessment:  Nutritive  Tools:  Not for latch  Instructed on use and cleaning of tool:  No.  Pre-feed weight:  3260 g, 7-3.0 oz  Post-feed weight:  3286 g , 7-3.9 oz  Amount transferred:  26 ml  Amount supplemented:  None   Additional Feeding Assessment -   Infant's oral assessment:  Variance see above note   Positioning:  Football Left breast  LATCH documentation:  Latch:  2 = Grasps breast easily, tongue down, lips flanged, rhythmical sucking.  Audible swallowing:  2 = Spontaneous and intermittent  Type of nipple:  2 = Everted at rest and after stimulation  Comfort (Breast/Nipple):  1 = Filling, red/small blisters or bruises,  mild/mod discomfort to AREAS OF SOFTNESS  Hold (Positioning):  1 = Assistance needed to correctly position infant at breast and maintain latch  LATCH score:  8   Attached assessment:  Shallow @# 1st , improved compared to 1st breast   Lips flanged:  No.  LC showed mom again how to flip upper lip to flanged position   Lips untucked:  Yes.    Suck assessment:  Nutritive and Nonnutritive  Tools:  Pump Instructed on use and cleaning of tool:  Yes.    Pre-feed weight:  3286g , 7-3.9 oz  Post-feed weight: 3296 g , 7-4.2 oz  Amount transferred:  10 ml  Amount supplemented: see below   Additional feeding :  Pre-weight - 3296g , 7-4.2 oz  Post -weight - 3298g , 7-4.3 oz  Amount Transferred: 19ml   LC had mom post pump after feeding both breast with her DEBP ( Medela ) from home . Per mom has been using the #27 flange at home , and LC suspected due to using the #27 flange  At home may be why she was not getting any EBM yield due to it being to big and lack of stimulation  When pumping.  LC switched mom back to the #24 and felt it was a better fit and would give her improved stimulation. Mom pumped for 15 -20 mins with 3 ml EBM yield . LC also covered mom up with a baby blanket so mom would  Not focus on the pump pieces. ( per mom she had been doing that at home) and still no yield.   Due to baby being fussy and not settling down after feeding  LC finger fed baby 3 ml of EBM ( mom had pumped off both breast ) and 12 ml of formula     Total amount pumped post feed:  3 ml off both breast   Total amount transferred:  38 ml  Total supplement given:  15 ml  Total amount for feeding : 53 ml   Lactation Impression :   LC feels mom is stressed , lack of rest and sleep , ( due to the baby not settling down to sleep ) therefore her milk is not letting down well. Mom teary eyed intermittently through out the 1 1/2 hour consult .  Also looking at moms perinatal hx - PCOS, and infertility can be an indicator of low milk supply.  Baby still noted to have some jaundice and non - nutritive at times when feeding , LC questioning whether that is due to flow  Or due to the short posterior frenulum.  No sore nipples noted .    Lactation Plan of Care:   Per mom has a F/U apt with Dr. Laurice Record [facility] 6/8  Stressed to mom due to "Slow weight gain : 7-3.0 oz , 6/1- today - Birth weight - 7-15 oz , also due to baby still appearing jaundice  Need to supplement with either EBM with SNS or Bottle ( medium based Medela or Dr. Owens Shark ) after every feeding.  Start out with 30 ml and increase to 45 -60 ml . ( mom receptive to supplementing plan )  Mom - - rest ,naps, plenty of fluids, especially water, nutritious snacks and meals  Skin to skin feedings until the baby can stay awake for the feeding and gaining well Feedings, every 2-3 hours and with feeding cues, cluster feedings normal when baby is trying to gain weight back up ,  also with growth spurts  Prior to feeding set up SNS ( Tools reviewed and mom shown ho to set up at consult )  Steps for latching : prior to latch - breast massage, hand express, either attach SNS before latch or after baby latches approx 1 inch  Latch with breast compressions until swallows and then intermittent . Average feeding each side 15 -20 mins  If Baby [name] feeds on 1st breast with supplementing and is satisfied , just post pump 2nd breast . Or feed both breast and divide supplement for both breast  Extra pumping - after 5-6 feedings a day post pump 10 -15 mins - save milk

## 2014-11-08 ENCOUNTER — Ambulatory Visit (HOSPITAL_COMMUNITY)
Admission: RE | Admit: 2014-11-08 | Discharge: 2014-11-08 | Disposition: A | Discharge status: Home / Self Care | Payer: 59 | Source: Ambulatory Visit | Attending: Obstetrics & Gynecology | Admitting: Obstetrics & Gynecology

## 2014-11-08 NOTE — Lactation Note (Signed)
Lactation Consult  Mother's reason for visit: follow up  Visit Type:  Feeding assessment , weight check and assessment of milk supply  Appointment Notes: F/U from 6/1 LC apt due to low milk supply and slow birth weight - apt confirmed  Consult:  Follow-Up Lactation Consultant:  Teresa Bell  ________________________________________________________________________ Teresa Bell Name: Teresa Bell Date of Birth: 10/19/2015 Pediatrician: Dr. Nicholes Bell  Gender: female Gestational Age: [redacted]w[redacted]d (At Birth) Birth Weight: 7 lb 15.5 oz (3615 g) Weight at Discharge: Weight: 7 lb 6 oz (3345 g)Date of Discharge: 10/21/2014 Filed Weights   10/19/14 1743 10/20/14 0019 10/20/14 2351  Weight: 7 lb 15.5 oz (3615 g) 7 lb 13 oz (3545 g) 7 lb 6 oz (3345 g)   Last weight taken from location outside of [facility]: 8-3 oz  Location:Pediatrician's office 6/8 today , also after weight baby had a large wet and stool diaper per mom  Weight today:3604 g , 7-15.1 oz         ________________________________________________________________________  Mother's Name: Teresa Bell Type of delivery:  Vaginal De;ivery  Breastfeeding Experience:  1st baby  Maternal Medical Conditions:  Polycystic ovarian syndrome, Hypothyroidism - Maternal Medications:  PNV , Synthroid   ________________________________________________________________________  Breastfeeding History (Post Discharge)  Frequency of breastfeeding: every 2-4 hours  Duration of feeding:  15 -30 mins   Supplementing : after every feeding with 30 ml increasing to 60 ml per mom   Pumping : with DEBP Medela with #24 Flange ( good fit per mom since last weeks Consult )   Infant Intake and Output Assessment  Voids:  8-10  in 24 hrs.  Color:  Clear yellow Stools:  2-4  in 24 hrs.  Color:  Yellow  ________________________________________________________________________  Maternal Breast Assessment  Breast:   Full Nipple:  Erect Pain level:  0 Pain interventions:  Expressed breast milk  _______________________________________________________________________ Feeding Assessment/Evaluation  Initial feeding assessment:  Infant's oral assessment:  Variance - short labial frenulum above gum line - and upper lip stretches better than last week  Possible posterior frenulum.   Positioning:  Football Left breast  LATCH documentation:  Latch:  2 = Grasps breast easily, tongue down, lips flanged, rhythmical sucking.  Audible swallowing:  2 = Spontaneous and intermittent  Type of nipple:  2 = Everted at rest and after stimulation  Comfort (Breast/Nipple):  1 = Filling, red/small blisters or bruises, mild/mod discomfort  Hold (Positioning):  1 = Assistance needed to correctly position infant at breast and maintain latch  LATCH score:  8  Attached assessment:  Deep  Lips flanged:  No.  Lips untucked:  Yes.    Suck assessment:  Nutritive  Tools:  None  Instructed on use and cleaning of tool:  No.  Pre-feed weight:  3604 g , 7-15.1 oz  Post-feed weight: 3642 g , 8-0.5 oz  Amount transferred:  38 ml  Amount supplemented:  Will be after 2nd latch   Additional Feeding Assessment -   Infant's oral assessment:  Variance see above note   Positioning:  Cross cradle Right breast  LATCH documentation:  Latch:  2 = Grasps breast easily, tongue down, lips flanged, rhythmical sucking.  Audible swallowing:  2 = Spontaneous and intermittent  Type of nipple:  2 = Everted at rest and after stimulation  Comfort (Breast/Nipple):  1 = Filling, red/small blisters or bruises, mild/mod discomfort  Hold (Positioning):  1 = Assistance needed to correctly position infant at breast and maintain latch  LATCH score:  8  Attached assessment:  Deep  Lips flanged:  No.  Lips untucked:  Yes.    Suck assessment:  Nutritive  Tools:  None  Instructed on use and cleaning of tool:  No.  Pre-feed weight:  3642 g ,  8-0.5 oz  Post-feed weight: 3654g , 8-0.9 oz  Amount transferred:  12 ml  Amount supplemented: 30 ml    Total amount pumped post feed:  Mom did not post pump  Total amount transferred: 50 ml  Total supplement given:  30 ml ( formula mom brought from home )  Total volume for feeding = 80 ml    Lactation Impression:  Milk volume transfer increased , but still need to supplement  For the volume needed to gain. And due to mom's hx of PCO's and lack of a lot breast changes  In pregnancy to post pump 10 -15 mins after 5-6 feedings a day.  Mom seemed rested today and more relaxed. Latched the baby with ease. LC did assist to flip  Upper lip to flanged position. MOM denies sore nipples , plugged ducts or engorgement.  Mom aware she will have to continue supplementing and also mentioned Dr. Laurice Bell told her to.  Per mom has only been pumping after 3 feedings a day ( much less than LC recommended last week , which was  After 5-6 feedings a day for 15 - 20 mins). Per mom only getting 30 ml with post pumping.  Per mom supplementing when EBM available , if not Enfamil Gentle ease .    Lactation Plan of Care :  Praised mom for her efforts breast feeding . Per mom F/U with Dr. Laurice Bell on 6/20 [facility] recommended attending the BFS[facility] at [facility] , either Monday evenings at 7 pm or Tuesday 11 am for  weight checks and BF support. Consider Herbs for Milk supply , hand outs supplied  Continue extra Calories , plenty fluids, especially water , nutritious snacks and meals.  Important breast compressions with latch until swallows and intermittent Check lip line for flanges lips at the breast or bottle  Challenge Teresa Bell to open mouth before latch  Extra pumping - at least 4 post pumping , 5-6 would be more stimulation 10 -15 mins.

## 2015-08-01 MED FILL — TIROSINT 100 MCG CAPSULE: 100 | 28 days supply | Qty: 28 | Fill #3

## 2015-08-23 MED FILL — TIROSINT 100 MCG CAPSULE: 100 | 28 days supply | Qty: 28 | Fill #4

## 2015-09-28 MED FILL — TIROSINT 100 MCG CAPSULE: 100 | 28 days supply | Qty: 28 | Fill #5

## 2015-11-01 MED FILL — TIROSINT 100 MCG CAPSULE: 100 | 28 days supply | Qty: 28 | Fill #6

## 2015-11-21 DIAGNOSIS — N390 Urinary tract infection, site not specified: Secondary | ICD-10-CM | POA: Diagnosis not present

## 2016-08-01 IMAGING — US US OB FOLLOW-UP
1 series · 12 of 28 positions shown · non-contrast
Comparison: none

[Series 1: us ob follow up · 12 of 69 slices shown]
[im 3/69]
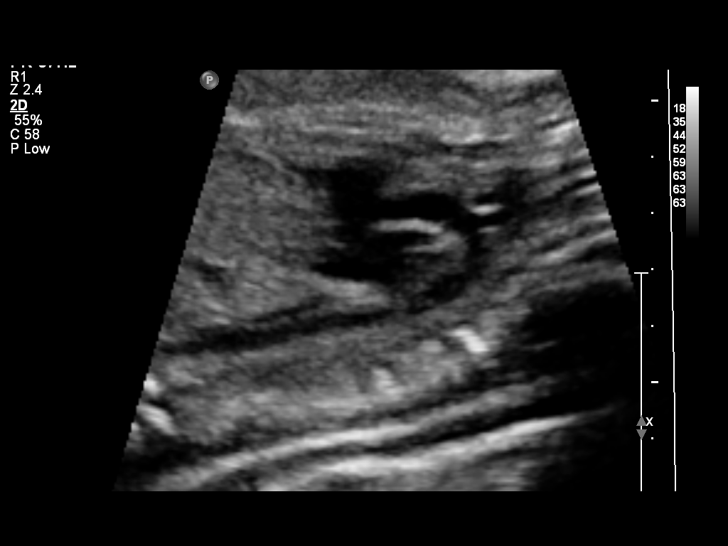
[im 8/69]
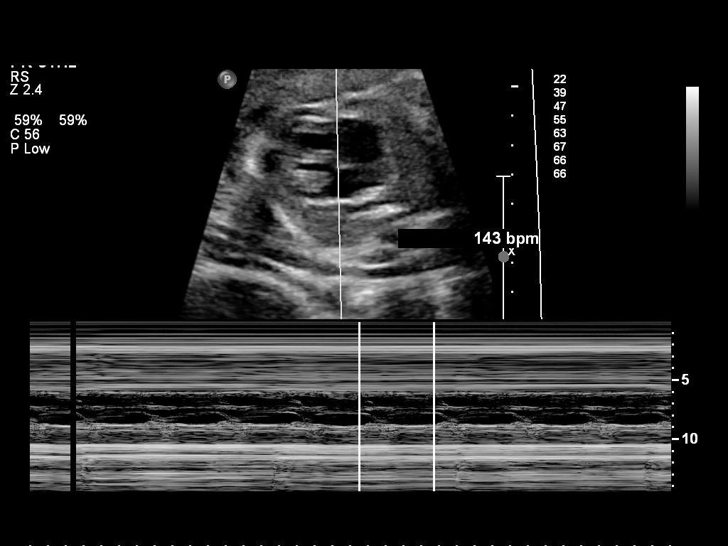
[im 13/69]
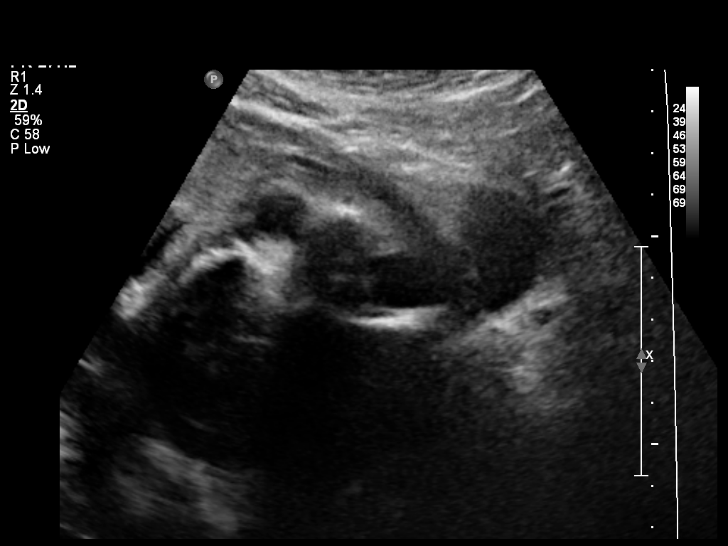
[im 21/69]
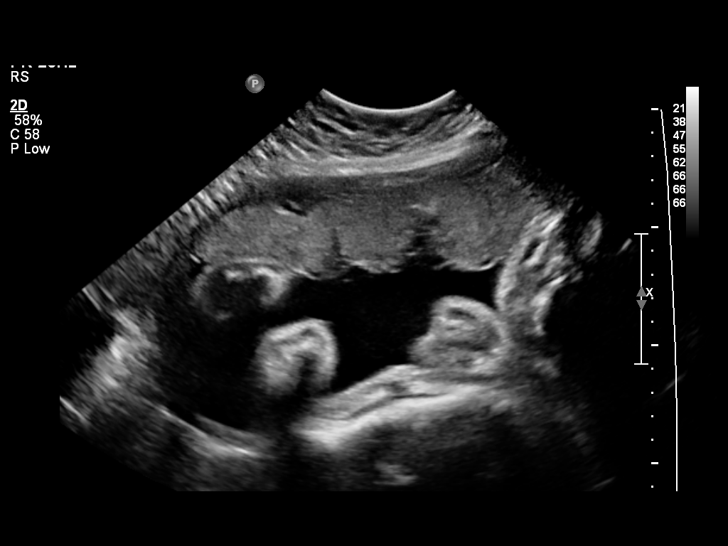
[im 26/69]
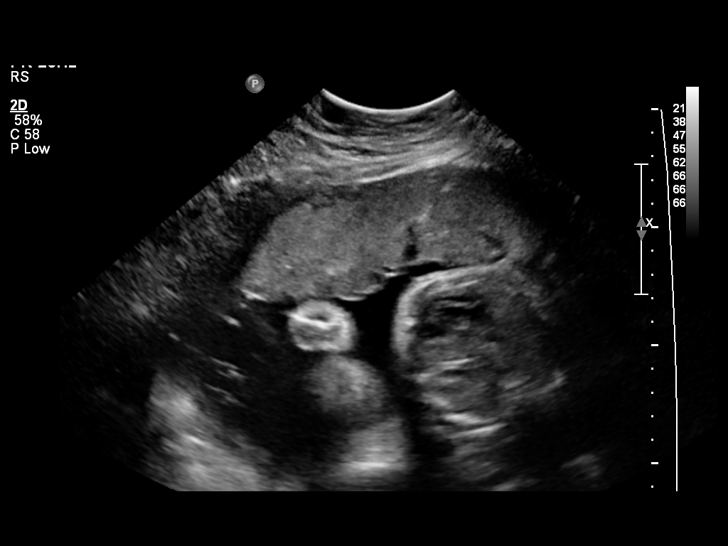
[im 31/69]
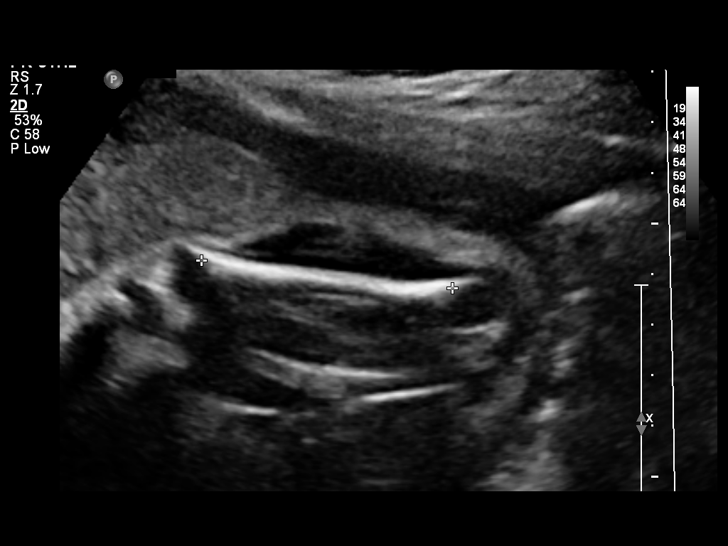
[im 38/69]
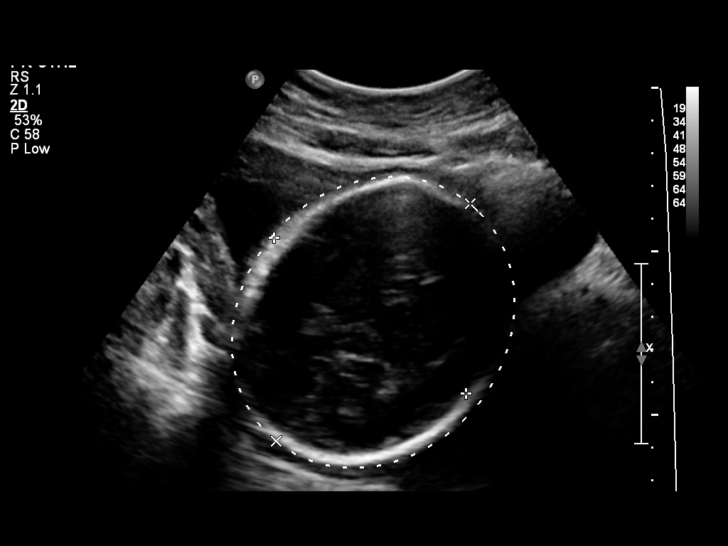
[im 43/69]
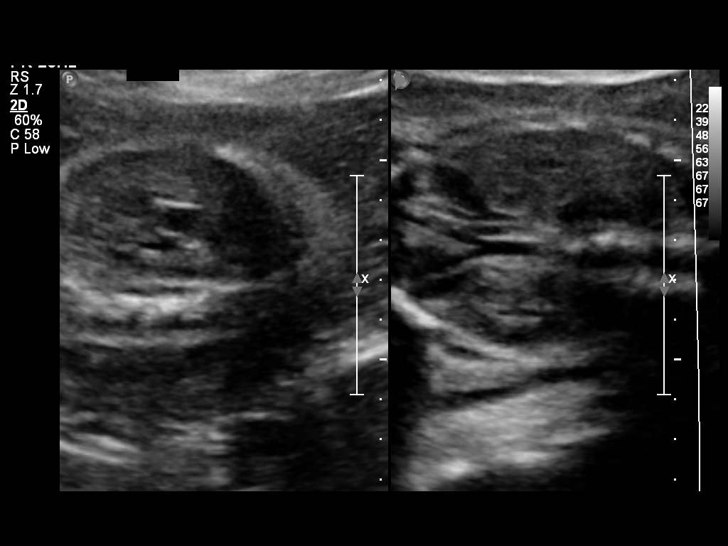
[im 48/69]
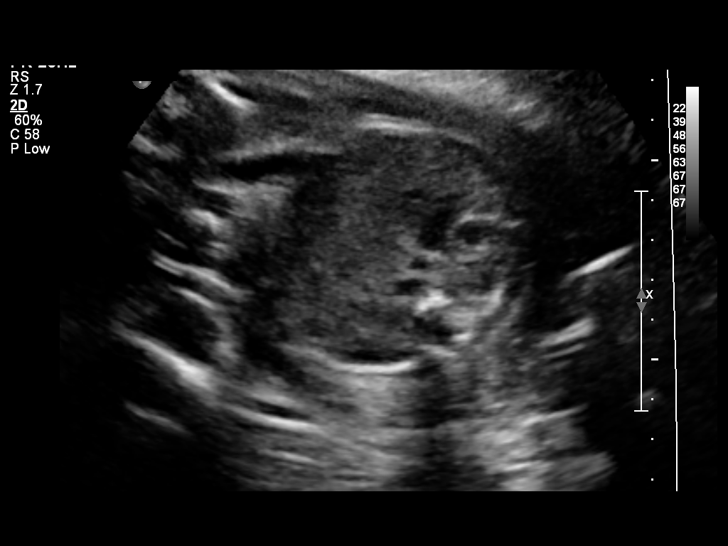
[im 56/69]
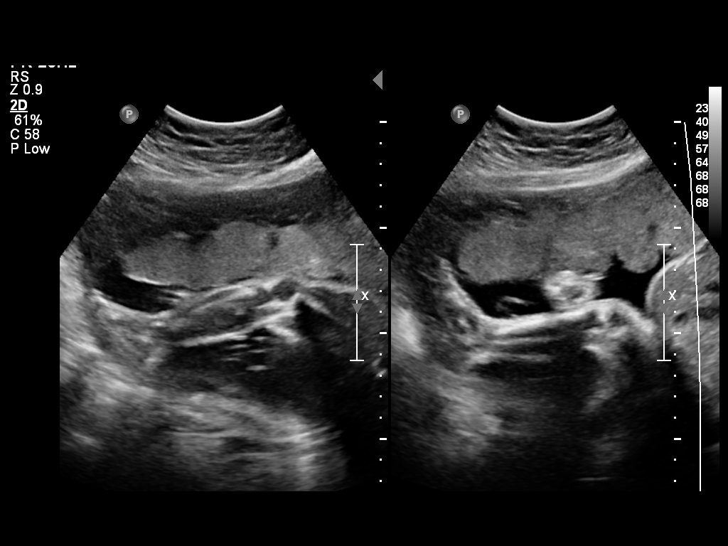
[im 61/69]
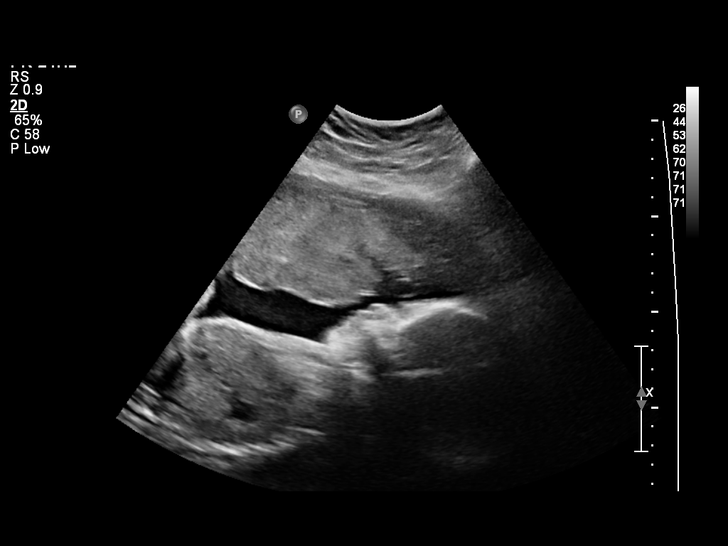
[im 66/69]
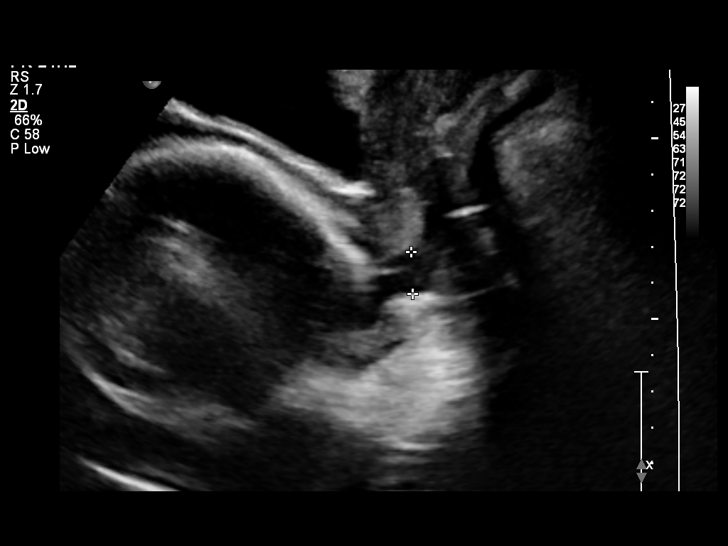

[12 of 28 positions shown; findings below may reference images not displayed]

OBSTETRICS REPORT
                      (Signed Final 08/07/2014 [DATE])

Service(s) Provided

 US OB FOLLOW UP                                       76816.1
Indications

 Cervical incompetence, second trimester (BMZ
 [DATE] & [DATE])
 Determine fetal presentation using ultrasound         Z36
 Hypothyroid (on meds)
 28 weeks gestation of pregnancy
Fetal Evaluation

 Num Of Fetuses:    1
 Fetal Heart Rate:  143                          bpm
 Cardiac Activity:  Observed
 Presentation:      Cephalic
 Placenta:          Anterior, above cervical os
 P. Cord            Visualized, central
 Insertion:

 Amniotic Fluid
 AFI FV:      Subjectively within normal limits
 AFI Sum:     13.81   cm       44  %Tile     Larg Pckt:    4.45  cm
 RUQ:   4.45    cm   RLQ:    2.46   cm    LUQ:   3.94    cm   LLQ:    2.96   cm
Biometry

 BPD:     76.1  mm     G. Age:  30w 4d                CI:        77.98   70 - 86
                                                      FL/HC:      18.4   19.6 -

 HC:     272.7  mm     G. Age:  29w 5d       55   %   HC/AC:      1.14   0.99 -

 AC:     239.9  mm     G. Age:  28w 2d       35   %   FL/BPD:     66.0   71 - 87
 FL:      50.2  mm     G. Age:  27w 0d         6  %   FL/AC:      20.9   20 - 24
 HUM:     45.2  mm     G. Age:  26w 5d         6  %
 Est. FW:    1107   gm   2 lb 10 oz      42  %
Gestational Age

 LMP:           28w 4d        Date:  01/19/14                 EDD:   10/26/14
 U/S Today:     28w 6d                                        EDD:   10/24/14
 Best:          28w 4d     Det. By:  LMP  (01/19/14)          EDD:   10/26/14
Anatomy

 Cranium:          Appears normal         Aortic Arch:      Appears normal
 Fetal Cavum:      Appears normal         Ductal Arch:      Appears normal
 Ventricles:       Appears normal         Diaphragm:        Previously seen
 Choroid Plexus:   Appears normal         Stomach:          Appears normal, left
                                                            sided
 Cerebellum:       Appears normal         Abdomen:          Appears normal
 Posterior Fossa:  Appears normal         Abdominal Wall:   Not well visualized
 Nuchal Fold:      Not applicable (>20    Cord Vessels:     Appears normal (3
                   wks GA)                                  vessel cord)
 Face:             Appears normal         Kidneys:          Appear normal
                   (orbits and profile)
 Lips:             Appears normal         Bladder:          Appears normal
 Heart:            Appears normal         Spine:            Not well visualized
                   (4CH, axis, and
                   situs)
 RVOT:             Appears normal         Lower             Previously seen
                                          Extremities:
 LVOT:             Appears normal         Upper             Previously seen
                                          Extremities:

 Other:  Female gender. Heels visualized. Technically difficult due to
         maternal habitus.
Cervix Uterus Adnexa

 Uterus:       No abnormality visualized.
 Cul De Sac:   No free fluid seen.

 Left Ovary:    Within normal limits.
 Right Ovary:   Within normal limits.
 Adnexa:     No abnormality visualized.
Impression

 SIUP at 91w2d
 EFW 42nd%'le
 No dysmorphic features, limitations as above
 No functional cervix to measure; consistent with prior exam
Recommendations

 1. correlation with clinical exam is recommended
 2. no further transvaginal evaluation of cervix by ultrasounds
 are recommended
 3. interval growth q4-6 weeks given hypothyroidism

 questions or concerns.

## 2016-11-26 DIAGNOSIS — R7301 Impaired fasting glucose: Secondary | ICD-10-CM | POA: Diagnosis not present

## 2016-11-26 DIAGNOSIS — E038 Other specified hypothyroidism: Secondary | ICD-10-CM | POA: Diagnosis not present

## 2016-11-26 DIAGNOSIS — Z0000 Encounter for general adult medical examination without abnormal findings: Secondary | ICD-10-CM | POA: Diagnosis not present

## 2016-12-11 DIAGNOSIS — Z1389 Encounter for screening for other disorder: Secondary | ICD-10-CM | POA: Diagnosis not present

## 2016-12-11 DIAGNOSIS — E663 Overweight: Secondary | ICD-10-CM | POA: Diagnosis not present

## 2016-12-11 DIAGNOSIS — G43909 Migraine, unspecified, not intractable, without status migrainosus: Secondary | ICD-10-CM | POA: Diagnosis not present

## 2016-12-11 DIAGNOSIS — Z0000 Encounter for general adult medical examination without abnormal findings: Secondary | ICD-10-CM | POA: Diagnosis not present

## 2016-12-11 DIAGNOSIS — E038 Other specified hypothyroidism: Secondary | ICD-10-CM | POA: Diagnosis not present

## 2016-12-11 DIAGNOSIS — E063 Autoimmune thyroiditis: Secondary | ICD-10-CM | POA: Diagnosis not present

## 2016-12-11 DIAGNOSIS — R7301 Impaired fasting glucose: Secondary | ICD-10-CM | POA: Diagnosis not present

## 2016-12-11 DIAGNOSIS — Z6828 Body mass index (BMI) 28.0-28.9, adult: Secondary | ICD-10-CM | POA: Diagnosis not present

## 2016-12-11 DIAGNOSIS — E282 Polycystic ovarian syndrome: Secondary | ICD-10-CM | POA: Diagnosis not present

## 2016-12-11 MED FILL — PHENTERMINE 15 MG CAPSULE: 15 | 14 days supply | Qty: 14 | Fill #0

## 2017-01-13 ENCOUNTER — Encounter: Payer: Self-pay | Admitting: Obstetrics & Gynecology

## 2017-01-13 ENCOUNTER — Ambulatory Visit (INDEPENDENT_AMBULATORY_CARE_PROVIDER_SITE_OTHER): Payer: 59 | Admitting: Obstetrics & Gynecology

## 2017-01-13 ENCOUNTER — Ambulatory Visit: Payer: 59 | Admitting: Registered"

## 2017-01-13 VITALS — BP 126/78 | Ht 66.0 in | Wt 174.0 lb

## 2017-01-13 DIAGNOSIS — Z01411 Encounter for gynecological examination (general) (routine) with abnormal findings: Secondary | ICD-10-CM

## 2017-01-13 DIAGNOSIS — N883 Incompetence of cervix uteri: Secondary | ICD-10-CM

## 2017-01-13 DIAGNOSIS — N945 Secondary dysmenorrhea: Secondary | ICD-10-CM | POA: Diagnosis not present

## 2017-01-13 DIAGNOSIS — Z789 Other specified health status: Secondary | ICD-10-CM

## 2017-01-13 DIAGNOSIS — N904 Leukoplakia of vulva: Secondary | ICD-10-CM

## 2017-01-13 NOTE — Progress Notes (Signed)
Teresa Bell Aug 26, 1985 [id number]   History:    31 y.o. G1P1  Married.  Daughter is 41 yo doing very well.  RP:  Established patient presenting for annual gyn exam  HPI:  Menses regular normal flow every month, but moderate cramping.  Using Advil.  No pelvic pain otherwise.  Using Clobetasol as needed for Lichen Sclerosus of Vulva.  Breasts wnl.  Good fitness.  BMI 28.1.  Breasts wnl.  Condoms, declines additional contraception (Had increased liver enzymes on BCPs previously), planning to attempt conception again around 06/2017.  H/O Cervical incompetence with Bed rest in hospital x many weeks, delivery at term finally.  Past medical history,surgical history, family history and social history were all reviewed and documented in the EPIC chart.  Gynecologic History Patient's last menstrual period was 01/08/2017. Contraception: condoms Last Pap: 08/2015. Results were: Neg/HPV HR neg Last mammogram: Never.   Obstetric History OB History  Gravida Para Term Preterm AB Living  1 1 1     1   SAB TAB Ectopic Multiple Live Births        0 1    # Outcome Date GA Lbr Len/2nd Weight Sex Delivery Anes PTL Lv  1 Term 10/19/14 [redacted]w[redacted]d 04:25 / 00:18 7 lb 15.5 oz (3.615 kg) F Vag-Spont EPI  LIV     Birth Comments: None       ROS: A ROS was performed and pertinent positives and negatives are included in the history.  GENERAL: No fevers or chills. HEENT: No change in vision, no earache, sore throat or sinus congestion. NECK: No pain or stiffness. CARDIOVASCULAR: No chest pain or pressure. No palpitations. PULMONARY: No shortness of breath, cough or wheeze. GASTROINTESTINAL: No abdominal pain, nausea, vomiting or diarrhea, melena or bright red blood per rectum. GENITOURINARY: No urinary frequency, urgency, hesitancy or dysuria. MUSCULOSKELETAL: No joint or muscle pain, no back pain, no recent trauma. DERMATOLOGIC: No rash, no itching, no lesions. ENDOCRINE: No polyuria, polydipsia, no heat or cold  intolerance. No recent change in weight. HEMATOLOGICAL: No anemia or easy bruising or bleeding. NEUROLOGIC: No headache, seizures, numbness, tingling or weakness. PSYCHIATRIC: No depression, no loss of interest in normal activity or change in sleep pattern.     Exam:   BP 126/78   Ht 5\' 6"  (1.676 m)   Wt 174 lb (78.9 kg)   LMP 01/08/2017 Comment: no birth control  BMI 28.08 kg/m   Body mass index is 28.08 kg/m.  General appearance : Well developed well nourished female. No acute distress HEENT: Eyes: no retinal hemorrhage or exudates,  Neck supple, trachea midline, no carotid bruits, no thyroidmegaly Lungs: Clear to auscultation, no rhonchi or wheezes, or rib retractions  Heart: Regular rate and rhythm, no murmurs or gallops Breast:Examined in sitting and supine position were symmetrical in appearance, no palpable masses or tenderness,  no skin retraction, no nipple inversion, no nipple discharge, no skin discoloration, no axillary or supraclavicular lymphadenopathy Abdomen: no palpable masses or tenderness, no rebound or guarding Extremities: no edema or skin discoloration or tenderness  Pelvic: Vulva:  Lichen Sclerosis with some atrophy of Labia Minora, but no active inflammation.  Bartholin, Urethra, Skene Glands: Within normal limits             Vagina: No gross lesions or discharge  Cervix: No gross lesions or discharge  Uterus  AV, normal size, shape and consistency, non-tender and mobile  Adnexa  Without masses or tenderness  Anus and perineum  normal  Assessment/Plan:  32 y.o. female for annual exam   1. Encounter for gynecological examination with abnormal finding Gyn exam with mild Lichen sclerosus of Vulva.  Pap neg/HPV HR neg 08/2015, will repeat next year.  Breasts wnl.  2. Use of condoms for contraception Will attempt conception around 06/2017, declines additional contraception.    3. Dysmenorrhea NSAIDS regularly starting day prior to menses recommended, will  call for Progestin only pill if worsens.  4. Lichen sclerosus of female genitalia 3. Dysmenorrhea Clobetasol 0.05% twice a week recommended until attempt conception, then stop.  5. Cervical incompetence H/O Cervical incompetence.  Recommend a Cervical Cerclage at 12 weeks.  Will probably refer to Dr Garwin Brothers for [facility].  Counseling on above issues >50% x 10 minutes.  Princess Bruins MD, 10:03 AM 01/13/2017

## 2017-01-13 NOTE — Patient Instructions (Signed)
1. Encounter for gynecological examination with abnormal finding Gyn exam with mild Lichen sclerosus of Vulva.  Pap neg/HPV HR neg 08/2015, will repeat next year.  Breasts wnl.  2. Use of condoms for contraception Will attempt conception around 06/2017, declines additional contraception.    3. Dysmenorrhea NSAIDS regularly starting day prior to menses recommended, will call for Progestin only pill if worsens.  4. Lichen sclerosus of female genitalia 3. Dysmenorrhea Clobetasol 0.05% twice a week recommended until attempt conception, then stop.  5. Cervical incompetence H/O Cervical incompetence.  Recommend a Cervical Cerclage at 12 weeks.  Will probably refer to Dr Garwin Brothers for [facility].  Keshawna, it was a pleasure to see you today!

## 2017-04-14 ENCOUNTER — Ambulatory Visit: Payer: Self-pay

## 2017-04-14 ENCOUNTER — Other Ambulatory Visit: Payer: Self-pay | Admitting: Occupational Medicine

## 2017-04-14 DIAGNOSIS — M25512 Pain in left shoulder: Secondary | ICD-10-CM

## 2017-06-02 NOTE — L&D Delivery Note (Signed)
Operative Delivery Note At 3:14 PM a viable and healthy female was delivered via Vaginal, Vacuum Neurosurgeon).  Presentation: vertex; Position: Left,, Occiput,, Sacrum,; Station: +2.  Verbal consent: obtained from patient.  Risks and benefits discussed in detail.  Risks include, but are not limited to the risks of anesthesia, bleeding, infection, damage to maternal tissues, fetal cephalhematoma.  There is also the risk of inability to effect vaginal delivery of the head, or shoulder dystocia that cannot be resolved by established maneuvers, leading to the need for emergency cesarean section. Indication: deep variables with pushing/ctx APGAR: 9, 9; weight 9 lb 2.6 oz (4155 g).   Placenta status:spontaneous intact not sent  , .   Cord: CANx 1 reducible with the following complications: marginal insertion .  Cord pH: n/a  Anesthesia: epidural  Instruments: mushroom Episiotomy: None Lacerations: 2nd degree perineal, right vaginal sulcus Suture Repair: 3.0 chromic vicryl Est. Blood Loss (mL): 553  Mom to postpartum.  Baby to Couplet care / Skin to Skin.  Ashten Prats A Janes Colegrove 04/27/2018, 4:13 PM

## 2017-07-08 DIAGNOSIS — E038 Other specified hypothyroidism: Secondary | ICD-10-CM | POA: Diagnosis not present

## 2017-07-20 MED FILL — LEVOTHYROXINE 100 MCG TABLE: 100 | 90 days supply | Qty: 90 | Fill #0

## 2017-08-17 ENCOUNTER — Telehealth: Payer: Self-pay | Admitting: *Deleted

## 2017-08-17 NOTE — Telephone Encounter (Signed)
Pt called had positive UPT and asked if she can still see Dr. Dellis Filbert . I called pt back and left on detailed message on voicemail that she can to call and schedule visit.

## 2017-08-25 ENCOUNTER — Ambulatory Visit: Payer: 59 | Admitting: Obstetrics & Gynecology

## 2017-08-25 ENCOUNTER — Encounter: Payer: Self-pay | Admitting: Obstetrics & Gynecology

## 2017-08-25 VITALS — BP 126/84

## 2017-08-25 DIAGNOSIS — Z8759 Personal history of other complications of pregnancy, childbirth and the puerperium: Secondary | ICD-10-CM | POA: Diagnosis not present

## 2017-08-25 DIAGNOSIS — Z8742 Personal history of other diseases of the female genital tract: Secondary | ICD-10-CM

## 2017-08-25 DIAGNOSIS — Z3201 Encounter for pregnancy test, result positive: Secondary | ICD-10-CM | POA: Diagnosis not present

## 2017-08-25 DIAGNOSIS — Z3491 Encounter for supervision of normal pregnancy, unspecified, first trimester: Secondary | ICD-10-CM

## 2017-08-25 LAB — PREGNANCY, URINE: Preg Test, Ur: POSITIVE — AB

## 2017-08-25 NOTE — Progress Notes (Signed)
    Teresa Bell 01-Nov-1985 [id number]        32 y.o.  G1P1L1  Married.  Daughter doing well 2+ yo.  RP: First trimester pregnancy confirmation  HPI: Certain of LMP 5/00/3704 at 4 6/7 wks.  Pelvic cramping, but no bleeding.  Nausea, no vomiting.  On PNVs and Levothyroxine.  H/O Cervical Incompetence in G1 with long hospitalization starting at 26 4/7 wks for Cervical Incompetence at 4 cm/80% without UCs or infection, intact membranes.  Finally induction at 39 wks at 6 cm/100%.     OB History  Gravida Para Term Preterm AB Living  1 1 1     1   SAB TAB Ectopic Multiple Live Births        0 1    # Outcome Date GA Lbr Len/2nd Weight Sex Delivery Anes PTL Lv  1 Term 10/19/14 [redacted]w[redacted]d 04:25 / 00:18 7 lb 15.5 oz (3.615 kg) F Vag-Spont EPI  LIV     Birth Comments: None    Past medical history,surgical history, problem list, medications, allergies, family history and social history were all reviewed and documented in the EPIC chart.   Directed ROS with pertinent positives and negatives documented in the history of present illness/assessment and plan.  Exam:  Vitals:   08/25/17 0955  BP: 126/84   General appearance:  Normal  Gynecologic exam: Vulva normal.  Speculum: Cervix long and closed.  Vagina normal.  Normal vaginal secretions with no bleeding.  Bimanual exam: Anteverted uterus normal volume nontender.  No adnexal mass.  Cervix long, closed and firm.   Assessment/Plan:  32 y.o. G1P1001   1. First trimester pregnancy Positive urine pregnancy test today.  Last menstrual period normal July 21, 2017.  At 4 weeks and 6 days today.  Expected date of delivery April 27, 2018.  Will confirm dating and viability at follow-up in 2 weeks by OB ultrasound.  Continue with prenatal vitamins.  Vitamin B6 as needed for nausea.  Patient will call back for treatment if has significant nausea and vomiting. - US OB Transvaginal; Future  2. History of cervical incompetence History of cervical  incompetence with longer hospitalization starting at 26 weeks and 4 days with cervix at 4 cm / 80% without uterine contractions or rupture of membranes at that time.  Finally induced at 39 weeks for advanced dilation at 6 cm / 100%.  Recommend cervical cerclage at 12 weeks and hydroxyprogesterone starting at 16 weeks.  Will refer to [facility] with Dr. cousins after next ultrasound appointment here. - US OB Transvaginal; Future  Counseling on above issues and coordination of care more than 50% for 15 minutes.  Princess Bruins MD, 10:03 AM 08/25/2017

## 2017-08-25 NOTE — Patient Instructions (Signed)
1. First trimester pregnancy Positive urine pregnancy test today.  Last menstrual period normal July 21, 2017.  At 4 weeks and 6 days today.  Expected date of delivery April 27, 2018.  Will confirm dating and viability at follow-up in 2 weeks by OB ultrasound.  Continue with prenatal vitamins.  Vitamin B6 as needed for nausea.  Patient will call back for treatment if has significant nausea and vomiting. - US OB Transvaginal; Future  2. History of cervical incompetence History of cervical incompetence with longer hospitalization starting at 26 weeks and 4 days with cervix at 4 cm / 80% without uterine contractions or rupture of membranes at that time.  Finally induced at 39 weeks for advanced dilation at 6 cm / 100%.  Recommend cervical cerclage at 12 weeks and hydroxyprogesterone starting at 16 weeks.  Will refer to [facility] with Dr. cousins after next ultrasound appointment here. - US OB Transvaginal; Future  Teresa Bell, it was a pleasure seeing you today and congratulations!!!  I will see you soon for your OB ultrasound.   Eating Plan for Pregnant Women While you are pregnant, your body will require additional nutrition to help support your growing baby. It is recommended that you consume:  150 additional calories each day during your first trimester.  300 additional calories each day during your second trimester.  300 additional calories each day during your third trimester.  Eating a healthy, well-balanced diet is very important for your health and for your baby's health. You also have a higher need for some vitamins and minerals, such as folic acid, calcium, iron, and vitamin D. What do I need to know about eating during pregnancy?  Do not try to lose weight or go on a diet during pregnancy.  Choose healthy, nutritious foods. Choose  of a sandwich with a glass of milk instead of a candy bar or a high-calorie sugar-sweetened beverage.  Limit your overall intake of foods that  have "empty calories." These are foods that have little nutritional value, such as sweets, desserts, candies, sugar-sweetened beverages, and fried foods.  Eat a variety of foods, especially fruits and vegetables.  Take a prenatal vitamin to help meet the additional needs during pregnancy, specifically for folic acid, iron, calcium, and vitamin D.  Remember to stay active. Ask your health care provider for exercise recommendations that are specific to you.  Practice good food safety and cleanliness, such as washing your hands before you eat and after you prepare raw meat. This helps to prevent foodborne illnesses, such as listeriosis, that can be very dangerous for your baby. Ask your health care provider for more information about listeriosis. What does 150 extra calories look like? Healthy options for an additional 150 calories each day could be any of the following:  Plain low-fat yogurt (6-8 oz) with  cup of berries.  1 apple with 2 teaspoons of peanut butter.  Cut-up vegetables with  cup of hummus.  Low-fat chocolate milk (8 oz or 1 cup).  1 string cheese with 1 medium orange.   of a peanut butter and jelly sandwich on whole-wheat bread (1 tsp of peanut butter).  For 300 calories, you could eat two of those healthy options each day. What is a healthy amount of weight to gain? The recommended amount of weight for you to gain is based on your pre-pregnancy BMI. If your pre-pregnancy BMI was:  Less than 18 (underweight), you should gain 28-40 lb.  18-24.9 (normal), you should gain 25-35 lb.  25-29.9 (overweight), you should gain 15-25 lb.  Greater than 30 (obese), you should gain 11-20 lb.  What if I am having twins or multiples? Generally, pregnant women who will be having twins or multiples may need to increase their daily calories by 300-600 calories each day. The recommended range for total weight gain is 25-54 lb, depending on your pre-pregnancy BMI. Talk with your  health care provider for specific guidance about additional nutritional needs, weight gain, and exercise during your pregnancy. What foods can I eat? Grains Any grains. Try to choose whole grains, such as whole-wheat bread, oatmeal, or brown rice. Vegetables Any vegetables. Try to eat a variety of colors and types of vegetables to get a full range of vitamins and minerals. Remember to wash your vegetables well before eating. Fruits Any fruits. Try to eat a variety of colors and types of fruit to get a full range of vitamins and minerals. Remember to wash your fruits well before eating. Meats and Other Protein Sources Lean meats, including chicken, [place], fish, and lean cuts of beef, veal, or pork. Make sure that all meats are cooked to "well done." Tofu. Tempeh. Beans. Eggs. Peanut butter and other nut butters. Seafood, such as shrimp, crab, and lobster. If you choose fish, select types that are higher in omega-3 fatty acids, including salmon, herring, mussels, trout, sardines, and pollock. Make sure that all meats are cooked to food-safe temperatures. Dairy Pasteurized milk and milk alternatives. Pasteurized yogurt and pasteurized cheese. Cottage cheese. Sour cream. Beverages Water. Juices that contain 100% fruit juice or vegetable juice. Caffeine-free teas and decaffeinated coffee. Drinks that contain caffeine are okay to drink, but it is better to avoid caffeine. Keep your total caffeine intake to less than 200 mg each day (12 oz of coffee, tea, or soda) or as directed by your health care provider. Condiments Any pasteurized condiments. Sweets and Desserts Any sweets and desserts. Fats and Oils Any fats and oils. The items listed above may not be a complete list of recommended foods or beverages. Contact your dietitian for more options. What foods are not recommended? Vegetables Unpasteurized (raw) vegetable juices. Fruits Unpasteurized (raw) fruit juices. Meats and Other Protein  Sources Cured meats that have nitrates, such as bacon, salami, and hotdogs. Luncheon meats, bologna, or other deli meats (unless they are reheated until they are steaming hot). Refrigerated pate, meat spreads from a meat counter, smoked seafood that is found in the refrigerated section of a store. Raw fish, such as sushi or sashimi. High mercury content fish, such as tilefish, shark, swordfish, and king mackerel. Raw meats, such as tuna or beef tartare. Undercooked meats and poultry. Make sure that all meats are cooked to food-safe temperatures. Dairy Unpasteurized (raw) milk and any foods that have raw milk in them. Soft cheeses, such as feta, queso blanco, queso fresco, Brie, Camembert cheeses, blue-veined cheeses, and Panela cheese (unless it is made with pasteurized milk, which must be stated on the label). Beverages Alcohol. Sugar-sweetened beverages, such as sodas, teas, or energy drinks. Condiments Homemade fermented foods and drinks, such as pickles, sauerkraut, or kombucha drinks. (Store-bought pasteurized versions of these are okay.) Other Salads that are made in the store, such as ham salad, chicken salad, egg salad, tuna salad, and seafood salad. The items listed above may not be a complete list of foods and beverages to avoid. Contact your dietitian for more information. This information is not intended to replace advice given to you by your health care provider. Make  sure you discuss any questions you have with your health care provider. Document Released: 03/03/2014 Document Revised: 10/25/2015 Document Reviewed: 11/01/2013 Elsevier Interactive Patient Education  [employer].

## 2017-09-08 ENCOUNTER — Ambulatory Visit (INDEPENDENT_AMBULATORY_CARE_PROVIDER_SITE_OTHER): Payer: 59

## 2017-09-08 ENCOUNTER — Other Ambulatory Visit: Payer: Self-pay | Admitting: Obstetrics & Gynecology

## 2017-09-08 ENCOUNTER — Ambulatory Visit (INDEPENDENT_AMBULATORY_CARE_PROVIDER_SITE_OTHER): Payer: 59 | Admitting: Obstetrics & Gynecology

## 2017-09-08 DIAGNOSIS — O3680X1 Pregnancy with inconclusive fetal viability, fetus 1: Secondary | ICD-10-CM | POA: Diagnosis not present

## 2017-09-08 DIAGNOSIS — Z3491 Encounter for supervision of normal pregnancy, unspecified, first trimester: Secondary | ICD-10-CM

## 2017-09-08 DIAGNOSIS — O219 Vomiting of pregnancy, unspecified: Secondary | ICD-10-CM

## 2017-09-08 DIAGNOSIS — Z8759 Personal history of other complications of pregnancy, childbirth and the puerperium: Secondary | ICD-10-CM

## 2017-09-08 DIAGNOSIS — Z8742 Personal history of other diseases of the female genital tract: Secondary | ICD-10-CM

## 2017-09-08 DIAGNOSIS — O3432 Maternal care for cervical incompetence, second trimester: Secondary | ICD-10-CM

## 2017-09-08 MED ORDER — DOXYLAMINE-PYRIDOXINE 10-10 MG PO TBEC: 2.0000 | DELAYED_RELEASE_TABLET | Freq: Every day | ORAL | 1 refills | Status: DC

## 2017-09-08 NOTE — Progress Notes (Signed)
    Teresa Bell 11/07/1985 [id number]        32 y.o.  G2P1001 married  RP: First trimester pregnancy for viability/dating Ob US  HPI: Doing well in the first trimester with no vaginal bleeding.  No pelvic pain.  Has nausea and occasional vomiting.  History of cervical incompetence.   OB History  Gravida Para Term Preterm AB Living  2 1 1     1   SAB TAB Ectopic Multiple Live Births        0 1    # Outcome Date GA Lbr Len/2nd Weight Sex Delivery Anes PTL Lv  2 Current           1 Term 10/19/14 [redacted]w[redacted]d 04:25 / 00:18 7 lb 15.5 oz (3.615 kg) F Vag-Spont EPI  LIV     Birth Comments: None    Past medical history,surgical history, problem list, medications, allergies, family history and social history were all reviewed and documented in the EPIC chart.   Directed ROS with pertinent positives and negatives documented in the history of present illness/assessment and plan.  Exam:  There were no vitals filed for this visit. General appearance:  Normal  Ob US today: T/V images.  Anteverted uterus with living single intrauterine pregnancy seen at the fundus.  Size equals dates by last menstrual period.  Crown-rump length at 11 mm which corresponds to 7 weeks and 0 day.  Fetal heart rate at 143 bpm.  Normal yolk sac.  Cervical length at 3.5 cm and closed.  Right ovary normal.  Left ovary with a corpus luteum cyst at 2.5 x 2 cm.   Assessment/Plan:  32 y.o. G2P1001   1. First trimester pregnancy Single intrauterine pregnancy with fetal heart rate at 143/min.  Ultrasound dating corresponds with last menstrual period.  Cervix is closed and measures 3.5 cm long.  Nausea with occasional vomiting.  Vitamin B6 recommended.  Diclegis prescribed, to be started if patient's vomiting worsens.  Refer to Dr. cousins at [facility] for obstetrical care.  2. Cervical incompetence affecting management of pregnancy in second trimester, antepartum Recommend cervical cerclage at 12 weeks.  3. Nausea/vomiting  in pregnancy Vitamin B6 recommended.  Diclegis prescribed as needed if vomiting worsens.  Other orders - Doxylamine-Pyridoxine 10-10 MG TBEC; Take 2 tablets by mouth at bedtime. As needed  Counseling on above issues and coordination of care more than 50% for 15 minutes.  Princess Bruins MD, 9:47 AM 09/08/2017

## 2017-09-13 ENCOUNTER — Encounter: Payer: Self-pay | Admitting: Obstetrics & Gynecology

## 2017-09-13 NOTE — Patient Instructions (Signed)
1. First trimester pregnancy Single intrauterine pregnancy with fetal heart rate at 143/min.  Ultrasound dating corresponds with last menstrual period.  Cervix is closed and measures 3.5 cm long.  Nausea with occasional vomiting.  Vitamin B6 recommended.  Diclegis prescribed, to be started if patient's vomiting worsens.  Refer to Dr. cousins at [facility] for obstetrical care.  2. Cervical incompetence affecting management of pregnancy in second trimester, antepartum Recommend cervical cerclage at 12 weeks.  3. Nausea/vomiting in pregnancy Vitamin B6 recommended.  Diclegis prescribed as needed if vomiting worsens.  Other orders - Doxylamine-Pyridoxine 10-10 MG TBEC; Take 2 tablets by mouth at bedtime. As needed  Teresa Bell, good seeing you today and congratulations!

## 2017-09-22 DIAGNOSIS — O99281 Endocrine, nutritional and metabolic diseases complicating pregnancy, first trimester: Secondary | ICD-10-CM | POA: Diagnosis not present

## 2017-09-22 DIAGNOSIS — Z3689 Encounter for other specified antenatal screening: Secondary | ICD-10-CM | POA: Diagnosis not present

## 2017-09-22 DIAGNOSIS — Z3481 Encounter for supervision of other normal pregnancy, first trimester: Secondary | ICD-10-CM | POA: Diagnosis not present

## 2017-09-22 DIAGNOSIS — E039 Hypothyroidism, unspecified: Secondary | ICD-10-CM | POA: Diagnosis not present

## 2017-09-22 DIAGNOSIS — Z3A09 9 weeks gestation of pregnancy: Secondary | ICD-10-CM | POA: Diagnosis not present

## 2017-09-22 DIAGNOSIS — O09211 Supervision of pregnancy with history of pre-term labor, first trimester: Secondary | ICD-10-CM | POA: Diagnosis not present

## 2017-09-30 DIAGNOSIS — Z3481 Encounter for supervision of other normal pregnancy, first trimester: Secondary | ICD-10-CM | POA: Diagnosis not present

## 2017-09-30 DIAGNOSIS — Z3A10 10 weeks gestation of pregnancy: Secondary | ICD-10-CM | POA: Diagnosis not present

## 2017-09-30 DIAGNOSIS — Z3689 Encounter for other specified antenatal screening: Secondary | ICD-10-CM | POA: Diagnosis not present

## 2017-09-30 DIAGNOSIS — O3431 Maternal care for cervical incompetence, first trimester: Secondary | ICD-10-CM | POA: Diagnosis not present

## 2017-10-05 ENCOUNTER — Other Ambulatory Visit: Payer: Self-pay | Admitting: Obstetrics and Gynecology

## 2017-10-16 ENCOUNTER — Encounter (HOSPITAL_BASED_OUTPATIENT_CLINIC_OR_DEPARTMENT_OTHER): Payer: Self-pay | Admitting: *Deleted

## 2017-10-16 NOTE — Progress Notes (Signed)
SPOKE W/ PT VIA PHONE FOR PRE-OP INTERVIEW.  NPO AFTER MN.  ARRIVE AT 0530. WILL TAKE SYNTHROID AM DOS W/ SIPS OF WATER.

## 2017-10-22 LAB — OB RESULTS CONSOLE ABO/RH: RH Type: POSITIVE

## 2017-10-22 LAB — OB RESULTS CONSOLE GC/CHLAMYDIA
Chlamydia: NEGATIVE
Gonorrhea: NEGATIVE

## 2017-10-22 LAB — OB RESULTS CONSOLE RUBELLA ANTIBODY, IGM: Rubella: IMMUNE

## 2017-10-22 LAB — OB RESULTS CONSOLE ANTIBODY SCREEN: Antibody Screen: NEGATIVE

## 2017-10-22 LAB — OB RESULTS CONSOLE HIV ANTIBODY (ROUTINE TESTING): HIV: NONREACTIVE

## 2017-10-22 LAB — OB RESULTS CONSOLE HEPATITIS B SURFACE ANTIGEN: HEP B S AG: NEGATIVE

## 2017-10-22 LAB — OB RESULTS CONSOLE RPR: RPR: NONREACTIVE

## 2017-10-23 ENCOUNTER — Ambulatory Visit (HOSPITAL_BASED_OUTPATIENT_CLINIC_OR_DEPARTMENT_OTHER): Payer: 59 | Admitting: Anesthesiology

## 2017-10-23 ENCOUNTER — Encounter (HOSPITAL_BASED_OUTPATIENT_CLINIC_OR_DEPARTMENT_OTHER)
Admission: RE | Disposition: A | Discharge status: Home / Self Care | Payer: Self-pay | Source: Ambulatory Visit | Attending: Obstetrics and Gynecology

## 2017-10-23 ENCOUNTER — Ambulatory Visit (HOSPITAL_BASED_OUTPATIENT_CLINIC_OR_DEPARTMENT_OTHER)
Admission: RE | Admit: 2017-10-23 | Discharge: 2017-10-23 | Disposition: A | Discharge status: Home / Self Care | Payer: 59 | Source: Ambulatory Visit | Attending: Obstetrics and Gynecology | Admitting: Obstetrics and Gynecology

## 2017-10-23 ENCOUNTER — Encounter (HOSPITAL_BASED_OUTPATIENT_CLINIC_OR_DEPARTMENT_OTHER): Payer: Self-pay | Admitting: *Deleted

## 2017-10-23 DIAGNOSIS — O3431 Maternal care for cervical incompetence, first trimester: Secondary | ICD-10-CM | POA: Diagnosis not present

## 2017-10-23 DIAGNOSIS — Z3A13 13 weeks gestation of pregnancy: Secondary | ICD-10-CM | POA: Insufficient documentation

## 2017-10-23 DIAGNOSIS — O99281 Endocrine, nutritional and metabolic diseases complicating pregnancy, first trimester: Secondary | ICD-10-CM | POA: Diagnosis not present

## 2017-10-23 DIAGNOSIS — O3432 Maternal care for cervical incompetence, second trimester: Secondary | ICD-10-CM | POA: Diagnosis not present

## 2017-10-23 DIAGNOSIS — E039 Hypothyroidism, unspecified: Secondary | ICD-10-CM | POA: Insufficient documentation

## 2017-10-23 HISTORY — DX: Family history of other specified conditions: Z84.89

## 2017-10-23 HISTORY — DX: Incompetence of cervix uteri: N88.3

## 2017-10-23 HISTORY — DX: Encounter for supervision of normal pregnancy, unspecified, unspecified trimester: Z34.90

## 2017-10-23 HISTORY — DX: Gastro-esophageal reflux disease without esophagitis: K21.9

## 2017-10-23 HISTORY — PX: CERVICAL CERCLAGE: SHX1329

## 2017-10-23 SURGERY — CERCLAGE, CERVIX, VAGINAL APPROACH
Anesthesia: Spinal

## 2017-10-23 MED ORDER — BUPIVACAINE IN DEXTROSE 0.75-8.25 % IT SOLN
INTRATHECAL | Status: DC | PRN
  Administered 2017-10-23: 1 mL via INTRATHECAL

## 2017-10-23 MED ORDER — ONDANSETRON HCL 4 MG/2ML IJ SOLN
INTRAMUSCULAR | Status: AC
  Filled 2017-10-23: qty 2

## 2017-10-23 MED ORDER — SODIUM CHLORIDE 0.9 % IR SOLN
Status: DC | PRN
  Administered 2017-10-23: 500 mL

## 2017-10-23 MED ORDER — LACTATED RINGERS IV SOLN
INTRAVENOUS | Status: DC
  Administered 2017-10-23: 07:00:00 via INTRAVENOUS
  Filled 2017-10-23: qty 1000

## 2017-10-23 MED ORDER — FENTANYL CITRATE (PF) 100 MCG/2ML IJ SOLN
INTRAMUSCULAR | Status: AC
  Filled 2017-10-23: qty 2

## 2017-10-23 MED ORDER — FENTANYL CITRATE (PF) 100 MCG/2ML IJ SOLN
INTRAMUSCULAR | Status: DC | PRN
  Administered 2017-10-23: 100 ug via INTRAVENOUS
  Administered 2017-10-23: 10 ug via INTRATHECAL

## 2017-10-23 MED ORDER — ONDANSETRON HCL 4 MG/2ML IJ SOLN
4.0000 mg | Freq: Once | INTRAMUSCULAR | Status: AC
  Administered 2017-10-23: 4 mg via INTRAVENOUS
  Filled 2017-10-23: qty 2

## 2017-10-23 MED ORDER — ONDANSETRON HCL 4 MG/2ML IJ SOLN
INTRAMUSCULAR | Status: DC | PRN
  Administered 2017-10-23: 4 mg via INTRAVENOUS

## 2017-10-23 MED ORDER — PROPOFOL 10 MG/ML IV BOLUS
INTRAVENOUS | Status: AC
  Filled 2017-10-23: qty 20

## 2017-10-23 SURGICAL SUPPLY — 35 items
BRIEF STRETCH FOR OB PAD LRG (UNDERPADS AND DIAPERS) ×3 IMPLANT
CATH FOLEY 2WAY SLVR 30CC 16FR (CATHETERS) IMPLANT
CATH ROBINSON RED A/P 16FR (CATHETERS) ×3 IMPLANT
CLOTH BEACON ORANGE TIMEOUT ST (SAFETY) ×3 IMPLANT
COUNTER NEEDLE 1200 MAGNETIC (NEEDLE) ×3 IMPLANT
COVER BACK TABLE 60X90IN (DRAPES) ×3 IMPLANT
DRAPE LG THREE QUARTER DISP (DRAPES) ×3 IMPLANT
DRAPE UNDERBUTTOCKS STRL (DRAPE) ×3 IMPLANT
ELECT REM PT RETURN 9FT ADLT (ELECTROSURGICAL) ×3
ELECTRODE REM PT RTRN 9FT ADLT (ELECTROSURGICAL) ×1 IMPLANT
GLOVE BIO SURGEON STRL SZ 6.5 (GLOVE) ×2 IMPLANT
GLOVE BIO SURGEONS STRL SZ 6.5 (GLOVE) ×1
GLOVE BIOGEL PI IND STRL 7.0 (GLOVE) ×2 IMPLANT
GLOVE BIOGEL PI INDICATOR 7.0 (GLOVE) ×4
GOWN STRL REUS W/ TWL XL LVL3 (GOWN DISPOSABLE) ×2 IMPLANT
GOWN STRL REUS W/TWL XL LVL3 (GOWN DISPOSABLE) ×6
KIT TURNOVER CYSTO (KITS) ×3 IMPLANT
LEGGING LITHOTOMY PAIR STRL (DRAPES) ×3 IMPLANT
MANIFOLD NEPTUNE II (INSTRUMENTS) IMPLANT
NS IRRIG 500ML POUR BTL (IV SOLUTION) ×3 IMPLANT
PACK BASIN DAY SURGERY FS (CUSTOM PROCEDURE TRAY) ×3 IMPLANT
PAD OB MATERNITY 4.3X12.25 (PERSONAL CARE ITEMS) ×3 IMPLANT
PAD PREP 24X48 CUFFED NSTRL (MISCELLANEOUS) ×3 IMPLANT
PENCIL BUTTON HOLSTER BLD 10FT (ELECTRODE) ×3 IMPLANT
SPONGE LAP 4X18 X RAY DECT (DISPOSABLE) IMPLANT
SUT ETHIBOND  5 (SUTURE) ×2
SUT ETHIBOND 5 (SUTURE) ×1 IMPLANT
SUT PROLENE 1 CTX 30  8455H (SUTURE) ×2
SUT PROLENE 1 CTX 30 8455H (SUTURE) ×1 IMPLANT
SYR BULB IRRIGATION 50ML (SYRINGE) ×3 IMPLANT
TOWEL OR 17X24 6PK STRL BLUE (TOWEL DISPOSABLE) ×6 IMPLANT
TRAY DSU PREP LF (CUSTOM PROCEDURE TRAY) ×3 IMPLANT
TUBE CONNECTING 12'X1/4 (SUCTIONS)
TUBE CONNECTING 12X1/4 (SUCTIONS) IMPLANT
YANKAUER SUCT BULB TIP NO VENT (SUCTIONS) IMPLANT

## 2017-10-23 NOTE — Anesthesia Postprocedure Evaluation (Signed)
Anesthesia Post Note  Patient: Teresa Bell  Procedure(s) Performed: CERCLAGE CERVICAL (N/A )     Patient location during evaluation: PACU Anesthesia Type: Spinal Level of consciousness: awake Pain management: pain level controlled Vital Signs Assessment: post-procedure vital signs reviewed and stable Respiratory status: spontaneous breathing Cardiovascular status: stable Postop Assessment: patient able to bend at knees Anesthetic complications: no    Last Vitals:  Vitals:   10/23/17 1020 10/23/17 1226  BP: 102/62 102/62  Pulse: (!) 51 (!) 56  Resp: 13 16  Temp:  37 C  SpO2: 100% 100%    Last Pain:  Vitals:   10/23/17 1226  TempSrc: Oral  PainSc:                  Dayan Kreis

## 2017-10-23 NOTE — Anesthesia Preprocedure Evaluation (Addendum)
Anesthesia Evaluation  Patient identified by MRN, date of birth, ID band Patient awake  General Assessment Comment:Pregnant. Case discussed with patient and family of spinal . Risks discussed in detail.Pateint agreed.  Reviewed: Allergy & Precautions, NPO status , Patient's Chart, lab work & pertinent test results  History of Anesthesia Complications (+) Family history of anesthesia reaction  Airway Mallampati: II  TM Distance: >3 FB     Dental   Pulmonary    breath sounds clear to auscultation       Cardiovascular negative cardio ROS   Rhythm:Regular Rate:Normal     Neuro/Psych    GI/Hepatic Neg liver ROS, GERD  ,  Endo/Other  Hypothyroidism   Renal/GU      Musculoskeletal   Abdominal   Peds  Hematology   Anesthesia Other Findings   Reproductive/Obstetrics                            Anesthesia Physical Anesthesia Plan  ASA: II  Anesthesia Plan: Spinal   Post-op Pain Management:    Induction: Intravenous  PONV Risk Score and Plan: Treatment may vary due to age or medical condition  Airway Management Planned: Simple Face Mask and Nasal Cannula  Additional Equipment:   Intra-op Plan:   Post-operative Plan:   Informed Consent: I have reviewed the patients History and Physical, chart, labs and discussed the procedure including the risks, benefits and alternatives for the proposed anesthesia with the patient or authorized representative who has indicated his/her understanding and acceptance.   Dental advisory given  Plan Discussed with:   Anesthesia Plan Comments:         Anesthesia Quick Evaluation

## 2017-10-23 NOTE — Transfer of Care (Signed)
Immediate Anesthesia Transfer of Care Note  Patient: Teresa Bell  Procedure(s) Performed: CERCLAGE CERVICAL (N/A )  Patient Location: PACU  Anesthesia Type:Spinal  Level of Consciousness: awake, alert  and oriented  Airway & Oxygen Therapy: Patient Spontanous Breathing  Post-op Assessment: Report given to RN and Post -op Vital signs reviewed and stable  Post vital signs: Reviewed and stable  Last Vitals:  Vitals Value Taken Time  BP    Temp    Pulse    Resp    SpO2      Last Pain:  Vitals:   10/23/17 0536  TempSrc: Oral         Complications: No apparent anesthesia complications

## 2017-10-23 NOTE — Progress Notes (Signed)
Fetal heart rate 148-checked per Dr Garwin Brothers at bedside

## 2017-10-23 NOTE — Brief Op Note (Signed)
10/23/2017  8:11 AM  PATIENT:  Teresa Bell  32 y.o. female  PRE-OPERATIVE DIAGNOSIS:  Cervical Incompetence, IUP @ 13 4/7 week  POST-OPERATIVE DIAGNOSIS:  Cervical Incompetence, IUP @ 13 4/7 weeks  PROCEDURE:  MacDonald cervical cerclage  SURGEON:  Surgeon(s) and Role:    * Servando Salina, MD - Primary  PHYSICIAN ASSISTANT:   ASSISTANTS: none   ANESTHESIA:   spinal  EBL:  Minimal  BLOOD ADMINISTERED:none  DRAINS: none   LOCAL MEDICATIONS USED:  NONE  SPECIMEN:  No Specimen  DISPOSITION OF SPECIMEN:  N/A  COUNTS:  YES  TOURNIQUET:  * No tourniquets in log *  DICTATION: .Other Dictation: Dictation Number [id number]  PLAN OF CARE: Discharge to home after PACU  PATIENT DISPOSITION:  PACU - hemodynamically stable.   Delay start of Pharmacological VTE agent (>24hrs) due to surgical blood loss or risk of bleeding: no

## 2017-10-23 NOTE — Discharge Instructions (Signed)
Nothing per vagina x 2 weeks  Call your surgeon if you experience:   1.  Fever over 101.0. 2.  Inability to urinate. 3.  Nausea and/or vomiting. 4. Vaginal bleeding and or cramping. 5.  Any problems and/or concerns.  Post Anesthesia Home Care Instructions  Activity: Get plenty of rest for the remainder of the day. A responsible individual must stay with you for 24 hours following the procedure.  For the next 24 hours, DO NOT: -Drive a car -Paediatric nurse -Drink alcoholic beverages  -Take any medication unless instructed by your physician -Make any legal decisions or sign important papers.  Meals: Start with liquid foods such as gelatin or soup. Progress to regular foods as tolerated. Avoid greasy, spicy, heavy foods. If nausea and/or vomiting occur, drink only clear liquids until the nausea and/or vomiting subsides. Call your physician if vomiting continues.  Special Instructions/Symptoms: Your throat may feel dry or sore from the anesthesia or the breathing tube placed in your throat during surgery. If this causes discomfort, gargle with warm salt water. The discomfort should disappear within 24 hours.  If you had a scopolamine patch placed behind your ear for the management of post- operative nausea and/or vomiting:  1. The medication in the patch is effective for 72 hours, after which it should be removed.  Wrap patch in a tissue and discard in the trash. Wash hands thoroughly with soap and water. 2. You may remove the patch earlier than 72 hours if you experience unpleasant side effects which may include dry mouth, dizziness or visual disturbances. 3. Avoid touching the patch. Wash your hands with soap and water after contact with the patch.

## 2017-10-23 NOTE — Op Note (Signed)
NAME: Teresa Bell, [place] RECORD OI:[id number] ACCOUNT [id number] DATE OF BIRTH:1985/10/11 FACILITY: [facility] LOCATION: [facility] PHYSICIAN:Emmalie Haigh A. Aspen Lawrance, MD  OPERATIVE REPORT  DATE OF PROCEDURE:  10/23/2017  PREOPERATIVE DIAGNOSES:  Cervical incompetence, intrauterine gestation at 32 and 4/7 weeks.  POSTOPERATIVE DIAGNOSIS:   Cervical incompetence, intrauterine gestation at 63 and 4/7 weeks.  PROCEDURE:  McDonald cervical cerclage.  ANESTHESIA:  Spinal.  SURGEON:  Odalys Win A. Garwin Brothers, MD   ASSISTANT:  None.  DESCRIPTION OF PROCEDURE:  Under adequate spinal anesthesia, the patient was placed in the dorsal lithotomy position.  She was sterilely prepped and draped in the usual fashion.  Bladder was catheterized with a moderate amount of urine.  Examination  under anesthesia revealed a parous cervix, external os fingertip.  Wide ectropion noted.  A weighted speculum was placed in the vagina.  Sims retractor was placed anteriorly.  The cervix was gently grasped with small clamps and a #1 Prolene was used to  perform a McDonald cerclage in the usual fashion starting at the 12 o'clock position and Ethibond was used to tag the suture.  The McDonald cerclage was performed without incident.  All instruments were then removed from the vagina.  Cervix was closed.  SPECIMEN:  None.  ESTIMATED BLOOD LOSS:  Minimal.  COMPLICATIONS:  None.    DISPOSITION:  The patient tolerated the procedure well and was transferred to recovery room in stable condition where fetal heart rate will be obtained.  AN/NUANCE  D:10/23/2017 T:10/23/2017 JOB:[id number]

## 2017-10-23 NOTE — H&P (Addendum)
Teresa Bell is a 32 y.o. female presenting@ 13 4/[redacted] week gestation  for  Cervical cerclage placement due to cervical incompetence.  OB History    Gravida  2   Para  1   Term  1   Preterm      AB      Living  1     SAB      TAB      Ectopic      Multiple  0   Live Births  1          Past Medical History:  Diagnosis Date  . Family history of adverse reaction to anesthesia    mother-- ponv  . GERD (gastroesophageal reflux disease)   . Hypothyroidism   . Incompetent cervix   . Polycystic ovary syndrome   . Pregnancy    Past Surgical History:  Procedure Laterality Date  . ORIF RIGHT ANKLE FRACTURE  2005   retained hardware   Family History: family history includes Cancer in her maternal grandfather, maternal grandmother, and maternal uncle; Diabetes in her maternal grandfather and mother; Heart disease in her maternal grandfather and mother; Hypertension in her father and paternal grandfather. Social History:  reports that she has never smoked. She has never used smokeless tobacco. She reports that she does not drink alcohol or use drugs.       Review of Systems  All other systems reviewed and are negative.  History   Height 5' 6.5" (1.689 m), weight 86.2 kg (190 lb), last menstrual period 07/21/2017. Exam Physical Exam  Constitutional: She is oriented to person, place, and time. She appears well-developed and well-nourished.  HENT:  Head: Atraumatic.  Eyes: EOM are normal.  Neck: Neck supple.  Cardiovascular: Regular rhythm.  Respiratory: Breath sounds normal.  GI: Soft.  Genitourinary: Vagina normal.  Genitourinary Comments: Uterus gravid Adnexa nl cervix closed  Neurological: She is alert and oriented to person, place, and time.  Skin: Skin is warm.    Prenatal labs: ABO, Rh:   Antibody:  neg Rubella:  Immune  RPR:   NR HBsAg:   neg HIV:   neg GBS:   not done  Assessment/Plan: Cervical incompetence IUP@ 13 4/7 weeks P) Mcdonald  cervical cerclage. Risk of surgery reviewed including infection, bleeding, injury to surrounding organ structures. All ? answered   Amarissa Koerner A Emmer Lillibridge 10/23/2017, 3:14 AM  Update:  I have reexamined pt No change since last visit

## 2017-10-23 NOTE — Anesthesia Procedure Notes (Signed)
Spinal  Start time: 10/23/2017 7:35 AM End time: 10/23/2017 7:50 AM Staffing Anesthesiologist: Belinda Block, MD Performed: anesthesiologist  Preanesthetic Checklist Completed: patient identified, site marked, surgical consent, pre-op evaluation, timeout performed, IV checked, risks and benefits discussed and monitors and equipment checked Spinal Block Patient position: sitting Prep: site prepped and draped and DuraPrep Patient monitoring: heart rate, cardiac monitor, continuous pulse ox and blood pressure Location: L3-4 Injection technique: single-shot Needle Needle gauge: 24 G Assessment Sensory level: T12 Additional Notes Clear CSF clear CSF. Spinal Marcaine w. Fentanyl 47mcg by Dr. Smith Robert. . Patient tolerated procedure well. Attempt previously by Dr. Nyoka Cowden

## 2017-10-27 ENCOUNTER — Encounter (HOSPITAL_BASED_OUTPATIENT_CLINIC_OR_DEPARTMENT_OTHER): Payer: Self-pay | Admitting: Obstetrics and Gynecology

## 2017-10-27 NOTE — Addendum Note (Signed)
Addendum  created 10/27/17 1211 by Bufford Spikes, CRNA   Attestation recorded in [place], [place] filed, [place] Event edited

## 2017-11-03 DIAGNOSIS — O99282 Endocrine, nutritional and metabolic diseases complicating pregnancy, second trimester: Secondary | ICD-10-CM | POA: Diagnosis not present

## 2017-11-03 DIAGNOSIS — Z361 Encounter for antenatal screening for raised alphafetoprotein level: Secondary | ICD-10-CM | POA: Diagnosis not present

## 2017-11-03 DIAGNOSIS — Z3A15 15 weeks gestation of pregnancy: Secondary | ICD-10-CM | POA: Diagnosis not present

## 2017-11-04 DIAGNOSIS — M6281 Muscle weakness (generalized): Secondary | ICD-10-CM | POA: Diagnosis not present

## 2017-11-04 DIAGNOSIS — R102 Pelvic and perineal pain: Secondary | ICD-10-CM | POA: Diagnosis not present

## 2017-11-24 ENCOUNTER — Encounter: Payer: Self-pay | Admitting: Skilled Nursing Facility1

## 2017-11-24 ENCOUNTER — Encounter: Payer: 59 | Attending: Obstetrics and Gynecology | Admitting: Skilled Nursing Facility1

## 2017-11-24 DIAGNOSIS — Z3A00 Weeks of gestation of pregnancy not specified: Secondary | ICD-10-CM | POA: Diagnosis not present

## 2017-11-24 DIAGNOSIS — O2600 Excessive weight gain in pregnancy, unspecified trimester: Secondary | ICD-10-CM | POA: Diagnosis not present

## 2017-11-24 DIAGNOSIS — O2602 Excessive weight gain in pregnancy, second trimester: Secondary | ICD-10-CM

## 2017-11-24 NOTE — Progress Notes (Signed)
  Assessment:  Primary concerns today: weight in pregnancy.   Pt states she is [redacted] weeks pregnant and states her doctor feels she has gained too much weight.  Pt states she used to be 300 pounds (started in 2014 with diet and exercise) and then had her first child after that. Pt states she feels she is about the same weight currently in her pregnancy as her last pregnancy. Pt states she was on bed rest and in the hospital for her last pregnancy. Pt states she has activity restrictions lifted to only walking when before her pregnancy she conducted vigerous activity. Pt states she has poor sleep, having trouble staying asleep. Pt states she feels like her energy level could be better. Pt states she really misses being able to run. Pt states she is very frustrated her doctor suggested she was doing something wrong and that is why she has gained weight. Pt admits to being afraid of eating certain foods for fear she will be 300 pounds again.   MEDICATIONS: See List   DIETARY INTAKE:  Usual eating pattern includes 3 meals and 2 snacks per day.  Everyday foods include none identified.  Avoided foods include grains/starchy vegetables.    24-hr recall:  B ( AM): egg whites with [place] sausage  Snk ( AM): 2 cubes of pineapple L ( PM): greenbeans and pork chop and watermelon Snk ( PM): yogurt D ( PM): salad with green pepper and onion with vinegarrette with hamburger patty Snk ( PM):  Beverages: 96 oz water, 1 cup of coffee  Usual physical activity: 3-4 days a Week walking about 45 minutes (decreased from what she usually did)    Estimated energy needs: 1800 calories 200 g carbohydrates 135 g protein 50 g fat  Progress Towards Goal(s):  In progress.   Nutritional Diagnosis:  NB-1.5 Disordered eating pattern As related to unhealthy relationship with food/her body.  As evidenced by 24 hr recall and lack of energy sources.    Intervention:  Nutrition counseling. Dietitian educated the pt on  balanced eating and the need to properly fuel her pregnancy. Goals: Do not engage in negative self talk in front of your children Add in more Complex carbohydrates throughout the day Look into working with a mental health provider as you work on your journey of a healthy relationship with your body and your relationship with food  Teaching Method Utilized:  Visual Auditory Hands on  Handouts given during visit include:  Meal ideas  Snack sheet  Barriers to learning/adherence to lifestyle change: none identified   Demonstrated degree of understanding via:  Teach Back   Monitoring/Evaluation:  Dietary intake, exercise, and body weight prn.

## 2017-11-27 DIAGNOSIS — Z3A18 18 weeks gestation of pregnancy: Secondary | ICD-10-CM | POA: Diagnosis not present

## 2017-11-27 DIAGNOSIS — O99282 Endocrine, nutritional and metabolic diseases complicating pregnancy, second trimester: Secondary | ICD-10-CM | POA: Diagnosis not present

## 2017-11-27 DIAGNOSIS — O358XX0 Maternal care for other (suspected) fetal abnormality and damage, not applicable or unspecified: Secondary | ICD-10-CM | POA: Diagnosis not present

## 2017-11-27 DIAGNOSIS — O3432 Maternal care for cervical incompetence, second trimester: Secondary | ICD-10-CM | POA: Diagnosis not present

## 2017-12-29 DIAGNOSIS — E038 Other specified hypothyroidism: Secondary | ICD-10-CM | POA: Diagnosis not present

## 2017-12-29 DIAGNOSIS — R7301 Impaired fasting glucose: Secondary | ICD-10-CM | POA: Diagnosis not present

## 2018-01-05 DIAGNOSIS — R7301 Impaired fasting glucose: Secondary | ICD-10-CM | POA: Diagnosis not present

## 2018-01-05 DIAGNOSIS — Z1389 Encounter for screening for other disorder: Secondary | ICD-10-CM | POA: Diagnosis not present

## 2018-01-05 DIAGNOSIS — G4700 Insomnia, unspecified: Secondary | ICD-10-CM | POA: Diagnosis not present

## 2018-01-05 DIAGNOSIS — G43909 Migraine, unspecified, not intractable, without status migrainosus: Secondary | ICD-10-CM | POA: Diagnosis not present

## 2018-01-05 DIAGNOSIS — Z331 Pregnant state, incidental: Secondary | ICD-10-CM | POA: Diagnosis not present

## 2018-01-05 DIAGNOSIS — Z0000 Encounter for general adult medical examination without abnormal findings: Secondary | ICD-10-CM | POA: Diagnosis not present

## 2018-01-05 DIAGNOSIS — E063 Autoimmune thyroiditis: Secondary | ICD-10-CM | POA: Diagnosis not present

## 2018-01-05 DIAGNOSIS — E038 Other specified hypothyroidism: Secondary | ICD-10-CM | POA: Diagnosis not present

## 2018-01-05 DIAGNOSIS — E282 Polycystic ovarian syndrome: Secondary | ICD-10-CM | POA: Diagnosis not present

## 2018-01-15 ENCOUNTER — Encounter: Payer: Self-pay | Admitting: Obstetrics & Gynecology

## 2018-01-22 DIAGNOSIS — O3432 Maternal care for cervical incompetence, second trimester: Secondary | ICD-10-CM | POA: Diagnosis not present

## 2018-01-22 DIAGNOSIS — O4692 Antepartum hemorrhage, unspecified, second trimester: Secondary | ICD-10-CM | POA: Diagnosis not present

## 2018-01-22 DIAGNOSIS — O99282 Endocrine, nutritional and metabolic diseases complicating pregnancy, second trimester: Secondary | ICD-10-CM | POA: Diagnosis not present

## 2018-01-22 DIAGNOSIS — Z3A26 26 weeks gestation of pregnancy: Secondary | ICD-10-CM | POA: Diagnosis not present

## 2018-02-04 DIAGNOSIS — Z3689 Encounter for other specified antenatal screening: Secondary | ICD-10-CM | POA: Diagnosis not present

## 2018-02-04 DIAGNOSIS — O3433 Maternal care for cervical incompetence, third trimester: Secondary | ICD-10-CM | POA: Diagnosis not present

## 2018-02-04 DIAGNOSIS — Z3A28 28 weeks gestation of pregnancy: Secondary | ICD-10-CM | POA: Diagnosis not present

## 2018-02-04 DIAGNOSIS — O99283 Endocrine, nutritional and metabolic diseases complicating pregnancy, third trimester: Secondary | ICD-10-CM | POA: Diagnosis not present

## 2018-02-16 DIAGNOSIS — Z23 Encounter for immunization: Secondary | ICD-10-CM | POA: Diagnosis not present

## 2018-03-15 DIAGNOSIS — Z3483 Encounter for supervision of other normal pregnancy, third trimester: Secondary | ICD-10-CM | POA: Diagnosis not present

## 2018-03-15 DIAGNOSIS — Z3482 Encounter for supervision of other normal pregnancy, second trimester: Secondary | ICD-10-CM | POA: Diagnosis not present

## 2018-03-26 DIAGNOSIS — O368330 Maternal care for abnormalities of the fetal heart rate or rhythm, third trimester, not applicable or unspecified: Secondary | ICD-10-CM | POA: Diagnosis not present

## 2018-03-26 DIAGNOSIS — Z3A35 35 weeks gestation of pregnancy: Secondary | ICD-10-CM | POA: Diagnosis not present

## 2018-03-26 DIAGNOSIS — O3663X0 Maternal care for excessive fetal growth, third trimester, not applicable or unspecified: Secondary | ICD-10-CM | POA: Diagnosis not present

## 2018-03-26 DIAGNOSIS — O3433 Maternal care for cervical incompetence, third trimester: Secondary | ICD-10-CM | POA: Diagnosis not present

## 2018-03-26 DIAGNOSIS — Z3685 Encounter for antenatal screening for Streptococcus B: Secondary | ICD-10-CM | POA: Diagnosis not present

## 2018-03-26 LAB — OB RESULTS CONSOLE GBS: STREP GROUP B AG: POSITIVE

## 2018-03-30 DIAGNOSIS — O368330 Maternal care for abnormalities of the fetal heart rate or rhythm, third trimester, not applicable or unspecified: Secondary | ICD-10-CM | POA: Diagnosis not present

## 2018-03-30 DIAGNOSIS — O368390 Maternal care for abnormalities of the fetal heart rate or rhythm, unspecified trimester, not applicable or unspecified: Secondary | ICD-10-CM | POA: Diagnosis not present

## 2018-03-30 DIAGNOSIS — Z3A36 36 weeks gestation of pregnancy: Secondary | ICD-10-CM | POA: Diagnosis not present

## 2018-04-15 ENCOUNTER — Telehealth (HOSPITAL_COMMUNITY): Payer: Self-pay | Admitting: *Deleted

## 2018-04-15 ENCOUNTER — Encounter (HOSPITAL_COMMUNITY): Payer: Self-pay | Admitting: *Deleted

## 2018-04-15 NOTE — Telephone Encounter (Signed)
Preadmission screen  

## 2018-04-26 ENCOUNTER — Other Ambulatory Visit: Payer: Self-pay | Admitting: Obstetrics and Gynecology

## 2018-04-27 ENCOUNTER — Inpatient Hospital Stay (HOSPITAL_COMMUNITY): Payer: 59 | Admitting: Anesthesiology

## 2018-04-27 ENCOUNTER — Inpatient Hospital Stay (HOSPITAL_COMMUNITY)
Admission: AD | Admit: 2018-04-27 | Discharge: 2018-04-28 | DRG: 806 | Disposition: A | Discharge status: Home / Self Care | Payer: 59 | Attending: Obstetrics and Gynecology | Admitting: Obstetrics and Gynecology

## 2018-04-27 ENCOUNTER — Encounter (HOSPITAL_COMMUNITY): Payer: Self-pay

## 2018-04-27 ENCOUNTER — Inpatient Hospital Stay (HOSPITAL_COMMUNITY): Admission: RE | Admit: 2018-04-27 | Payer: 59 | Source: Ambulatory Visit

## 2018-04-27 DIAGNOSIS — E039 Hypothyroidism, unspecified: Secondary | ICD-10-CM | POA: Diagnosis present

## 2018-04-27 DIAGNOSIS — O99824 Streptococcus B carrier state complicating childbirth: Secondary | ICD-10-CM | POA: Diagnosis present

## 2018-04-27 DIAGNOSIS — Z3A40 40 weeks gestation of pregnancy: Secondary | ICD-10-CM | POA: Diagnosis not present

## 2018-04-27 DIAGNOSIS — Z3490 Encounter for supervision of normal pregnancy, unspecified, unspecified trimester: Secondary | ICD-10-CM | POA: Diagnosis present

## 2018-04-27 DIAGNOSIS — D62 Acute posthemorrhagic anemia: Secondary | ICD-10-CM | POA: Diagnosis not present

## 2018-04-27 DIAGNOSIS — O9081 Anemia of the puerperium: Secondary | ICD-10-CM | POA: Diagnosis present

## 2018-04-27 DIAGNOSIS — Z880 Allergy status to penicillin: Secondary | ICD-10-CM | POA: Diagnosis not present

## 2018-04-27 DIAGNOSIS — O99284 Endocrine, nutritional and metabolic diseases complicating childbirth: Secondary | ICD-10-CM | POA: Diagnosis present

## 2018-04-27 DIAGNOSIS — Z3483 Encounter for supervision of other normal pregnancy, third trimester: Secondary | ICD-10-CM | POA: Diagnosis present

## 2018-04-27 DIAGNOSIS — O3433 Maternal care for cervical incompetence, third trimester: Secondary | ICD-10-CM | POA: Diagnosis not present

## 2018-04-27 DIAGNOSIS — O43123 Velamentous insertion of umbilical cord, third trimester: Secondary | ICD-10-CM | POA: Diagnosis present

## 2018-04-27 LAB — CBC
HCT: 33.8 % — ABNORMAL LOW (ref 36.0–46.0)
Hemoglobin: 11 g/dL — ABNORMAL LOW (ref 12.0–15.0)
MCH: 29.7 pg (ref 26.0–34.0)
MCHC: 32.5 g/dL (ref 30.0–36.0)
MCV: 91.4 fL (ref 80.0–100.0)
PLATELETS: 184 10*3/uL (ref 150–400)
RBC: 3.7 MIL/uL — ABNORMAL LOW (ref 3.87–5.11)
RDW: 13.1 % (ref 11.5–15.5)
WBC: 9.4 10*3/uL (ref 4.0–10.5)
nRBC: 0 % (ref 0.0–0.2)

## 2018-04-27 LAB — TYPE AND SCREEN
ABO/RH(D): O POS
Antibody Screen: NEGATIVE

## 2018-04-27 LAB — RPR: RPR Ser Ql: NONREACTIVE

## 2018-04-27 LAB — POCT FERN TEST: POCT Fern Test: POSITIVE

## 2018-04-27 MED ORDER — ONDANSETRON HCL 4 MG PO TABS: 4.0000 mg | ORAL_TABLET | ORAL | Status: DC | PRN

## 2018-04-27 MED ORDER — DIPHENHYDRAMINE HCL 50 MG/ML IJ SOLN: 12.5000 mg | INTRAMUSCULAR | Status: DC | PRN

## 2018-04-27 MED ORDER — WITCH HAZEL-GLYCERIN EX PADS
1.0000 "application " | MEDICATED_PAD | CUTANEOUS | Status: DC | PRN
  Administered 2018-04-27: 1 via TOPICAL

## 2018-04-27 MED ORDER — EPHEDRINE 5 MG/ML INJ
10.0000 mg | INTRAVENOUS | Status: DC | PRN
  Filled 2018-04-27: qty 2

## 2018-04-27 MED ORDER — OXYTOCIN 40 UNITS IN LACTATED RINGERS INFUSION - SIMPLE MED
2.5000 [IU]/h | INTRAVENOUS | Status: DC
  Administered 2018-04-27: 2.5 [IU]/h via INTRAVENOUS

## 2018-04-27 MED ORDER — OXYTOCIN 40 UNITS IN LACTATED RINGERS INFUSION - SIMPLE MED
1.0000 m[IU]/min | INTRAVENOUS | Status: DC
  Administered 2018-04-27: 2 m[IU]/min via INTRAVENOUS
  Filled 2018-04-27: qty 1000

## 2018-04-27 MED ORDER — LACTATED RINGERS AMNIOINFUSION
INTRAVENOUS | Status: DC
  Administered 2018-04-27: 14:00:00 via INTRAUTERINE
  Filled 2018-04-27 (×2): qty 1000

## 2018-04-27 MED ORDER — OXYCODONE HCL 5 MG PO TABS: 5.0000 mg | ORAL_TABLET | ORAL | Status: DC | PRN

## 2018-04-27 MED ORDER — LEVOTHYROXINE SODIUM 112 MCG PO TABS
112.0000 ug | ORAL_TABLET | Freq: Every day | ORAL | Status: DC
  Administered 2018-04-28: 112 ug via ORAL
  Filled 2018-04-27 (×2): qty 1

## 2018-04-27 MED ORDER — BENZOCAINE-MENTHOL 20-0.5 % EX AERO
1.0000 "application " | INHALATION_SPRAY | CUTANEOUS | Status: DC | PRN
  Administered 2018-04-27: 1 via TOPICAL
  Filled 2018-04-27 (×2): qty 56

## 2018-04-27 MED ORDER — PHENYLEPHRINE 40 MCG/ML (10ML) SYRINGE FOR IV PUSH (FOR BLOOD PRESSURE SUPPORT)
80.0000 ug | PREFILLED_SYRINGE | INTRAVENOUS | Status: DC | PRN
  Filled 2018-04-27: qty 10
  Filled 2018-04-27: qty 5

## 2018-04-27 MED ORDER — LIDOCAINE HCL (PF) 1 % IJ SOLN
30.0000 mL | INTRAMUSCULAR | Status: DC | PRN
  Filled 2018-04-27: qty 30

## 2018-04-27 MED ORDER — VANCOMYCIN HCL IN DEXTROSE 1-5 GM/200ML-% IV SOLN
1000.0000 mg | Freq: Two times a day (BID) | INTRAVENOUS | Status: DC
  Administered 2018-04-27: 1000 mg via INTRAVENOUS
  Filled 2018-04-27 (×2): qty 200

## 2018-04-27 MED ORDER — ONDANSETRON HCL 4 MG/2ML IJ SOLN: 4.0000 mg | Freq: Four times a day (QID) | INTRAMUSCULAR | Status: DC | PRN

## 2018-04-27 MED ORDER — OXYCODONE HCL 5 MG PO TABS: 10.0000 mg | ORAL_TABLET | ORAL | Status: DC | PRN

## 2018-04-27 MED ORDER — LIDOCAINE HCL (PF) 1 % IJ SOLN
INTRAMUSCULAR | Status: DC | PRN
  Administered 2018-04-27 (×2): 4 mL via EPIDURAL

## 2018-04-27 MED ORDER — HYDROXYZINE HCL 50 MG/ML IM SOLN
50.0000 mg | Freq: Four times a day (QID) | INTRAMUSCULAR | Status: DC | PRN
  Filled 2018-04-27: qty 1

## 2018-04-27 MED ORDER — LACTATED RINGERS IV SOLN: 500.0000 mL | INTRAVENOUS | Status: DC | PRN

## 2018-04-27 MED ORDER — LACTATED RINGERS IV SOLN
500.0000 mL | Freq: Once | INTRAVENOUS | Status: AC
  Administered 2018-04-27: 500 mL via INTRAVENOUS

## 2018-04-27 MED ORDER — MISOPROSTOL 25 MCG QUARTER TABLET
25.0000 ug | ORAL_TABLET | ORAL | Status: DC | PRN
  Filled 2018-04-27: qty 1

## 2018-04-27 MED ORDER — IBUPROFEN 600 MG PO TABS
600.0000 mg | ORAL_TABLET | Freq: Four times a day (QID) | ORAL | Status: DC
  Administered 2018-04-27 – 2018-04-28 (×4): 600 mg via ORAL
  Filled 2018-04-27 (×4): qty 1

## 2018-04-27 MED ORDER — ZOLPIDEM TARTRATE 5 MG PO TABS: 5.0000 mg | ORAL_TABLET | Freq: Every evening | ORAL | Status: DC | PRN

## 2018-04-27 MED ORDER — LACTATED RINGERS IV SOLN
INTRAVENOUS | Status: DC
  Administered 2018-04-27 (×2): via INTRAVENOUS

## 2018-04-27 MED ORDER — NALBUPHINE HCL 10 MG/ML IJ SOLN: 10.0000 mg | INTRAMUSCULAR | Status: DC | PRN

## 2018-04-27 MED ORDER — PHENYLEPHRINE 40 MCG/ML (10ML) SYRINGE FOR IV PUSH (FOR BLOOD PRESSURE SUPPORT)
80.0000 ug | PREFILLED_SYRINGE | INTRAVENOUS | Status: DC | PRN
  Filled 2018-04-27: qty 5

## 2018-04-27 MED ORDER — SIMETHICONE 80 MG PO CHEW: 80.0000 mg | CHEWABLE_TABLET | ORAL | Status: DC | PRN

## 2018-04-27 MED ORDER — SOD CITRATE-CITRIC ACID 500-334 MG/5ML PO SOLN: 30.0000 mL | ORAL | Status: DC | PRN

## 2018-04-27 MED ORDER — PRENATAL MULTIVITAMIN CH
1.0000 | ORAL_TABLET | Freq: Every day | ORAL | Status: DC
  Administered 2018-04-28: 1 via ORAL
  Filled 2018-04-27: qty 1

## 2018-04-27 MED ORDER — FERROUS SULFATE 325 (65 FE) MG PO TABS
325.0000 mg | ORAL_TABLET | Freq: Two times a day (BID) | ORAL | Status: DC
  Administered 2018-04-28: 325 mg via ORAL
  Filled 2018-04-27: qty 1

## 2018-04-27 MED ORDER — ONDANSETRON HCL 4 MG/2ML IJ SOLN: 4.0000 mg | INTRAMUSCULAR | Status: DC | PRN

## 2018-04-27 MED ORDER — FENTANYL 2.5 MCG/ML BUPIVACAINE 1/10 % EPIDURAL INFUSION (WH - ANES)
14.0000 mL/h | INTRAMUSCULAR | Status: DC | PRN
  Administered 2018-04-27 (×2): 14 mL/h via EPIDURAL
  Filled 2018-04-27 (×2): qty 100

## 2018-04-27 MED ORDER — ACETAMINOPHEN 325 MG PO TABS
650.0000 mg | ORAL_TABLET | ORAL | Status: DC | PRN
  Administered 2018-04-27: 650 mg via ORAL
  Filled 2018-04-27: qty 2

## 2018-04-27 MED ORDER — DIBUCAINE 1 % RE OINT: 1.0000 "application " | TOPICAL_OINTMENT | RECTAL | Status: DC | PRN

## 2018-04-27 MED ORDER — DIPHENHYDRAMINE HCL 25 MG PO CAPS: 25.0000 mg | ORAL_CAPSULE | Freq: Four times a day (QID) | ORAL | Status: DC | PRN

## 2018-04-27 MED ORDER — COCONUT OIL OIL: 1.0000 "application " | TOPICAL_OIL | Status: DC | PRN

## 2018-04-27 MED ORDER — SENNOSIDES-DOCUSATE SODIUM 8.6-50 MG PO TABS
2.0000 | ORAL_TABLET | ORAL | Status: DC
  Administered 2018-04-27: 2 via ORAL
  Filled 2018-04-27: qty 2

## 2018-04-27 MED ORDER — TERBUTALINE SULFATE 1 MG/ML IJ SOLN
0.2500 mg | Freq: Once | INTRAMUSCULAR | Status: DC | PRN
  Filled 2018-04-27: qty 1

## 2018-04-27 MED ORDER — OXYTOCIN BOLUS FROM INFUSION
500.0000 mL | Freq: Once | INTRAVENOUS | Status: AC
  Administered 2018-04-27: 500 mL via INTRAVENOUS

## 2018-04-27 MED ORDER — LACTATED RINGERS IV SOLN: 500.0000 mL | Freq: Once | INTRAVENOUS | Status: DC

## 2018-04-27 NOTE — Anesthesia Preprocedure Evaluation (Signed)
Anesthesia Evaluation  Patient identified by MRN, date of birth, ID band Patient awake    Reviewed: Allergy & Precautions, NPO status , Patient's Chart, lab work & pertinent test results  Airway Mallampati: II  TM Distance: >3 FB Neck ROM: Full    Dental  (+) Teeth Intact, Dental Advisory Given   Pulmonary neg pulmonary ROS,    Pulmonary exam normal breath sounds clear to auscultation       Cardiovascular negative cardio ROS Normal cardiovascular exam Rhythm:Regular Rate:Normal     Neuro/Psych negative neurological ROS  negative psych ROS   GI/Hepatic Neg liver ROS, GERD  Controlled,  Endo/Other  Hypothyroidism   Renal/GU negative Renal ROS  negative genitourinary   Musculoskeletal negative musculoskeletal ROS (+)   Abdominal   Peds negative pediatric ROS (+)  Hematology negative hematology ROS (+)   Anesthesia Other Findings   Reproductive/Obstetrics (+) Pregnancy                             Anesthesia Physical Anesthesia Plan  ASA: II  Anesthesia Plan: Epidural   Post-op Pain Management:    Induction:   PONV Risk Score and Plan: Treatment may vary due to age or medical condition  Airway Management Planned: Natural Airway  Additional Equipment:   Intra-op Plan:   Post-operative Plan:   Informed Consent: I have reviewed the patients History and Physical, chart, labs and discussed the procedure including the risks, benefits and alternatives for the proposed anesthesia with the patient or authorized representative who has indicated his/her understanding and acceptance.     Plan Discussed with: CRNA  Anesthesia Plan Comments:         Anesthesia Quick Evaluation

## 2018-04-27 NOTE — H&P (Signed)
Teresa Bell is a 32 y.o. female presenting at term with SROM clear fluid at 3:30 am. (+) mild ctx (+) FM. GBS cx (+). OB History    Gravida  2   Para  1   Term  1   Preterm      AB      Living  1     SAB      TAB      Ectopic      Multiple  0   Live Births  1          Past Medical History:  Diagnosis Date  . Family history of adverse reaction to anesthesia    mother-- ponv  . GERD (gastroesophageal reflux disease)   . Hypothyroidism   . Incompetent cervix   . Polycystic ovary syndrome   . Pregnancy    Past Surgical History:  Procedure Laterality Date  . CERVICAL CERCLAGE N/A 10/23/2017   Procedure: CERCLAGE CERVICAL;  Surgeon: Servando Salina, MD;  Location: [facility];  Service: Gynecology;  Laterality: N/A;  EDD: 04/27/18  . ORIF RIGHT ANKLE FRACTURE  2005   retained hardware   Family History: family history includes Cancer in her maternal grandfather and maternal grandmother; Diabetes in her maternal grandfather and mother; Heart disease in her maternal grandfather, mother, and paternal grandfather; Hypertension in her father and paternal grandfather. Social History:  reports that she has never smoked. She has never used smokeless tobacco. She reports that she does not drink alcohol or use drugs.     Maternal Diabetes: No Genetic Screening: Normal Maternal Ultrasounds/Referrals: Normal Fetal Ultrasounds or other Referrals:  Fetal echo due to fetal arrhythmia.  PVC's Maternal Substance Abuse:  No Significant Maternal Medications:  Meds include: Syntroid Significant Maternal Lab Results:  Lab values include: Group B Strep positive Other Comments:  hypothyroidism on med. fetal arrhythmia  cervical incompetence s/p cerclage  Review of Systems  All other systems reviewed and are negative.  Maternal Medical History:  Reason for admission: Rupture of membranes and contractions.   Contractions: Onset was 1-2 hours ago.   Perceived  severity is mild.    Fetal activity: Perceived fetal activity is normal.    Prenatal complications: Cerclage removal at 37 week   Prenatal Complications - Diabetes: none.    Exam by:: Esau Grew, RN Blood pressure 117/73, pulse 65, temperature 98.6 F (37 C), resp. rate 19, height 5\' 7"  (1.702 m), weight 112.2 kg, last menstrual period 07/21/2017, SpO2 100 %. Exam Physical Exam  Constitutional: She is oriented to person, place, and time. She appears well-developed and well-nourished.  HENT:  Head: Atraumatic.  Eyes: EOM are normal.  Neck: Neck supple.  Cardiovascular: Regular rhythm.  Respiratory: Breath sounds normal.  GI: Soft.  Musculoskeletal: She exhibits edema.  Neurological: She is alert and oriented to person, place, and time.  Skin: Skin is warm and dry.  Psychiatric: She has a normal mood and affect.  VE per RN 3.5/ 60/-2 Prenatal labs: ABO, Rh: B/Positive/-- (05/23 0000) Antibody: n (05/23 0000) Rubella: Immune (05/23 0000) RPR: Nonreactive (05/23 0000)  HBsAg: Negative (05/23 0000)  HIV: Non-reactive (05/23 0000)  GBS: Positive (10/25 0000)   Assessment/Plan: SROM Latent phase Term gestation Hypothyroidism GBS cx (+) on Vancomycin Fetal arrhythmia nl echo P) admit routine labs. IV vancomycin. Pitocin augmentation. EKG pp on fetus. Analgesic/epidural   Teresa Bell A Teresa Bell 04/27/2018, 5:44 AM

## 2018-04-27 NOTE — Lactation Note (Signed)
This note was copied from a baby's chart. Lactation Consultation Note  Patient Name: Teresa Bell Date: 04/27/2018 Reason for consult: Initial assessment;Term;Maternal endocrine disorder Type of Endocrine Disorder?: PCOS(and Thyroid with tx )  Per mom experienced low milk supply with her 1st baby and can't remember if she had breast changes with this pregnancy experienced breast changes.  Baby is 2 hours old  Baby latched after delivery for 30 mins at 1600.  Baby awake and rooting at [facility] visit and offered to assist mom with support  And checking latch. Mom started with the cradle position and noted shallow latch  Without depth. LC  Recommended to try the cross cradle and obtain the depth so the  Baby learns it and then switch her arms to the cradle once the baby is in a consistent  Swallowing pattern. Black smear changed by LC.  After mom latched the baby using the cradle position with breast compressions/ swallows noted. LC noted a small cheek dimple. Per mom I have dimples.  LC noted some areola edema and suspected it was due to some areola edema.  LC eased chin and showed mom how to do intermittent compressions and increased Swallows noted and the dimple decreased, but did not totally disappear.  LC instructed mom to wear shells at when not STS or sleeping, between feedings  To make the areolas more compressible for a deeper latch.  LC also discussed with mom prevention of soreness.  Dad mentioned the baby is latching so much better than our 1st baby did at this age.  Per mom has DEBP - Medela and a Spectra .  LC discussed with mom her hx of low milk supply. And adding post pumping after the baby has fed and settled on both breast. Since the baby is breast feeding well, DEBP can wait to be set up if mom desires.  To challenge her brain and to enhance milk supply. Mom mentioned she will be going back to work at 12 weeks and isn't sure if she will continue to breast feed.  LC  mentioned there are many options.  Mother informed of post-discharge support and given phone number to the lactation department, including services for phone call assistance; out-patient appointments; and breastfeeding support group. List of other breastfeeding resources in the community given in the handout. Encouraged mother to call for problems or concerns related to breastfeeding.  Both mom and dad receptive to teaching and review. And expressed appreciation for [facility] assistance.     Maternal Data Has patient been taught Hand Expression?: Yes Does the patient have breastfeeding experience prior to this delivery?: Yes  Feeding Feeding Type: Breast Fed  LATCH Score Latch: Repeated attempts needed to sustain latch, nipple held in mouth throughout feeding, stimulation needed to elicit sucking reflex.  Audible Swallowing: Spontaneous and intermittent  Type of Nipple: Everted at rest and after stimulation(some areola )  Comfort (Breast/Nipple): Soft / non-tender  Hold (Positioning): Assistance needed to correctly position infant at breast and maintain latch.  LATCH Score: 8  Interventions Interventions: Breast feeding basics reviewed;Assisted with latch;Skin to skin;Breast massage;Breast compression;Adjust position;Support pillows;Position options;Shells  Lactation Tools Discussed/Used Tools: Shells Shell Type: Inverted WIC Program: No   Consult Status Consult Status: Follow-up Date: 04/28/18 Follow-up type: In-patient    [place] 04/27/2018, 6:06 PM

## 2018-04-27 NOTE — Anesthesia Procedure Notes (Signed)
Epidural Patient location during procedure: OB Start time: 04/27/2018 8:48 AM End time: 04/27/2018 8:55 AM  Staffing Anesthesiologist: Brennan Bailey, MD Performed: anesthesiologist   Preanesthetic Checklist Completed: patient identified, pre-op evaluation, timeout performed, IV checked, risks and benefits discussed and monitors and equipment checked  Epidural Patient position: sitting Prep: site prepped and draped and DuraPrep Patient monitoring: continuous pulse ox, blood pressure, heart rate and cardiac monitor Approach: midline Location: L3-L4 Injection technique: LOR air  Needle:  Needle type: Tuohy  Needle gauge: 17 G Needle length: 9 cm Needle insertion depth: 6 cm Catheter type: closed end flexible Catheter size: 19 Gauge Catheter at skin depth: 11 cm Test dose: negative and Other (1% lidocaine)  Assessment Sensory level: T8 Events: blood not aspirated, injection not painful, no injection resistance, negative IV test and no paresthesia  Additional Notes Patient identified. Risks, benefits, and alternatives discussed with patient including but not limited to bleeding, infection, nerve damage, paralysis, failed block, incomplete pain control, headache, blood pressure changes, nausea, vomiting, reactions to medication, itching, and postpartum back pain. Confirmed with bedside nurse the patient's most recent platelet count. Confirmed with patient that they are not currently taking any anticoagulation, have any bleeding history, or any family history of bleeding disorders. Patient expressed understanding and wished to proceed. All questions were answered. Sterile technique was used throughout the entire procedure. Please see nursing notes for vital signs. First attempt at L4-5 with right-sided paraesthesia while threading catheter. Catheter and needle removed and next approach at L3-4, crisp LOR on first pass at this level with no paraesthesias. Catheter threaded easily. Test  dose was given through epidural catheter and negative prior to continuing to dose epidural or start infusion. Warning signs of high block given to the patient including shortness of breath, tingling/numbness in hands, complete motor block, or any concerning symptoms with instructions to call for help. Patient was given instructions on fall risk and not to get out of bed. All questions and concerns addressed with instructions to call with any issues or inadequate analgesia.  Reason for block:procedure for pain

## 2018-04-27 NOTE — Progress Notes (Signed)
Pt. With quick progression to second stage. Called at 1038am to attend delivery due to hx of precipitous second stage. Anterior lip reduced with good maternal pushing effort. Pt. Has continued to push somewhat effectively. My cervical exam at 11:40am baby feels asynclitic LOA with dysfunctional contraction pattern. Had pt. Push in side lying position, but it was not effective. Has been pushing for almost 2 hours S:  Feeling rectal pressure. Pitocin just increased to 8 milliunits.   O:  VS: Blood pressure (!) 118/59, pulse 62, temperature 98.2 F (36.8 C), temperature source Axillary, resp. rate 20, height 5\' 7"  (1.702 m), weight 112.2 kg, last menstrual period 07/21/2017, SpO2 100 %.        FHR : baseline 130 bpm / variability moderate / accelerations +15x15 / early and now recurrent variable decelerations to nadir of 90 bpm        Toco: contractions every 2-2.5 minutes / moderate-strong         Cervix : Dilation: 10 Dilation Complete Date: 04/27/18 Dilation Complete Time: 1043 Effacement (%): 90 Cervical Position: Posterior Station: Plus 1 Presentation: Vertex Exam by:: Ailene Ravel CNM        Membranes: ruptured   A: Second stage labor     FHR category 2    GBS positive - s/p Vancomycin  P: Discussed variables with Dr. Garwin Brothers - stop pushing, left lateral position     Orders received to attempt amnioinfusion with bolus 351mL then 133mL/hr, however, variables have now stopped since she stopped pushing. Will continue to assess and will attempt to administer if variables persist.     Lars Pinks, MSN, CNM [facility]

## 2018-04-27 NOTE — Anesthesia Pain Management Evaluation Note (Signed)
  CRNA Pain Management Visit Note  Patient: Teresa Bell, 32 y.o., female  "Hello I am a member of the anesthesia team at [facility]. We have an anesthesia team available at all times to provide care throughout the hospital, including epidural management and anesthesia for C-section. I don't know your plan for the delivery whether it a natural birth, water birth, IV sedation, nitrous supplementation, doula or epidural, but we want to meet your pain goals."   1.Was your pain managed to your expectations on prior hospitalizations?   Yes   2.What is your expectation for pain management during this hospitalization?     Epidural  3.How can we help you reach that goal? epidural  Record the patient's initial score and the patient's pain goal.   Pain: 7  Pain Goal: 8 [facility] wants you to be able to say your pain was always managed very well.  Bufford Spikes 04/27/2018

## 2018-04-27 NOTE — Progress Notes (Signed)
S: comfortable Stopped pushing  O; BP 112/61   Pulse 79   Temp 97.8 F (36.6 C) (Oral)   Resp 20   Ht 5\' 7"  (1.702 m)   Wt 112.2 kg   LMP 07/21/2017 (Exact Date)   SpO2 100%   BMI 38.72 kg/m    VE fully (+) 1/+2 with small caput, ROP  IUPC placed  Tracing: baseline 135 (+) variability (+) early decel Ctx  q 2 mins   IMP: complete Variable decelerations related to cord compression P) amnioinfusion. Restart pushing after bolus and eval of tracing

## 2018-04-28 DIAGNOSIS — D62 Acute posthemorrhagic anemia: Secondary | ICD-10-CM

## 2018-04-28 LAB — CBC
HCT: 26.2 % — ABNORMAL LOW (ref 36.0–46.0)
Hemoglobin: 8.8 g/dL — ABNORMAL LOW (ref 12.0–15.0)
MCH: 30.6 pg (ref 26.0–34.0)
MCHC: 33.6 g/dL (ref 30.0–36.0)
MCV: 91 fL (ref 80.0–100.0)
PLATELETS: 123 10*3/uL — AB (ref 150–400)
RBC: 2.88 MIL/uL — AB (ref 3.87–5.11)
RDW: 13.4 % (ref 11.5–15.5)
WBC: 7.1 10*3/uL (ref 4.0–10.5)
nRBC: 0 % (ref 0.0–0.2)

## 2018-04-28 MED ORDER — FERROUS SULFATE 325 (65 FE) MG PO TABS: 325.0000 mg | ORAL_TABLET | Freq: Two times a day (BID) | ORAL | 3 refills | Status: DC

## 2018-04-28 MED ORDER — IBUPROFEN 600 MG PO TABS: 600.0000 mg | ORAL_TABLET | Freq: Four times a day (QID) | ORAL | 0 refills | Status: AC

## 2018-04-28 NOTE — Progress Notes (Signed)
PPD #1, VAVD, 2nd degree repair and right vaginal sulcus, baby boy "Saralyn Pilar"  S:  Reports feeling well, a little sore, desires early discharge home today             Tolerating po/ No nausea or vomiting / Denies dizziness or SOB             Bleeding is light             Pain controlled with Motrin             Up ad lib / ambulatory / voiding QS  Newborn breast feeding - reports he is latching well  / Circumcision - completed today  O:               VS: BP (!) 104/52 (BP Location: Right Arm)   Pulse 66   Temp 98.6 F (37 C) (Oral)   Resp 18   Ht 5\' 7"  (1.702 m)   Wt 112.2 kg   LMP 07/21/2017 (Exact Date)   SpO2 100%   Breastfeeding? Unknown   BMI 38.72 kg/m    LABS:              Recent Labs    04/27/18 0520 04/28/18 0602  WBC 9.4 7.1  HGB 11.0* 8.8*  PLT 184 123*               Blood type: --/--/O POS (11/26 0520)  Rubella: Immune (05/23 0000)                     I&O: Intake/Output      11/26 0701 - 11/27 0700 11/27 0701 - 11/28 0700   Urine (mL/kg/hr) 50 (0)    Blood 605    Total Output 655    Net -655         Urine Occurrence 1 x                  Physical Exam:             Alert and oriented X3  Lungs: Clear and unlabored  Heart: regular rate and rhythm / no murmurs  Abdomen: soft, non-tender, non-distended              Fundus: firm, non-tender, U-1  Perineum: well approximated 2nd degree and sulcus - mild edema and erythema, no ecchymosis   Lochia: small, no clots  Extremities: trace pedal edema, no calf pain or tenderness    A/P: PPD # 1, VAVD  2nd degree repair and right sulcus  ABL Anemia    - on oral FE BID  Hypothyroidism   - on Synthroid 140mcg  Doing well - stable status  Routine post partum orders  Discharge home today  WOB discharge book given and warning s/s reviewed  F/u with Dr. Garwin Brothers in 6 weeks   Lars Pinks, MSN, CNM [facility]

## 2018-04-28 NOTE — Lactation Note (Signed)
This note was copied from a baby's chart. Lactation Consultation Note  Patient Name: Teresa Bell SLHTD'S Date: 04/28/2018 Reason for consult: Follow-up assessment Type of Endocrine Disorder?: PCOS  Baby is 18 hours  LC reviewed and updated the doc flow sheets from the yellow diary sheet.  Per mom the baby is in the nursery having a circ and last fed at 5 am, also cluster fed all  Night. Mom seems very tired and LC encouraged to her try to take a nap if the baby is sleepy after the circ. Per mom felt the baby breast fed as well as he did at the consult/ wearing shells recommended yesterday. Pumped x 1 over night with the DEBP with a few drops/ spoon fed back to baby.  Mom denies soreness, has the shells , and comfort gels given due to the areola edema  And potential for soreness. LC instructed mom on the use of hand pump and she has the DEBP kit, also her DEBP at home.  Sore  Nipple and engorgement prevention and tx reviewed.  Due to her hx of low milk recommended adding 4-5 post pumping for 10 -15 mins both  Breast when the baby isn't cluster feeding. Feed the milk back to the baby.  LC stressed STS feedings until he can stay awake for a feeding, back to birth weight and gaining well.  LC recommended going on line/ low milk supply for recipe for lactation cookies.  And consider checking out [employer] product - take as directed.  LC offered mom to request and LC O/P appt. And mom receptive for next week.  [place] placed a request  Mother informed of post-discharge support and given phone number to the lactation department, including services for phone call assistance; out-patient appointments; and breastfeeding support group. List of other breastfeeding resources in the community given in the handout. Encouraged mother to call for problems or concerns related to breastfeeding.     Maternal Data Has patient been taught Hand Expression?: Yes  Feeding    LATCH Score                    Interventions Interventions: Breast feeding basics reviewed  Lactation Tools Discussed/Used     Consult Status Consult Status: Follow-up Follow-up type: Out-patient    Jerlyn Ly Santrice Muzio 04/28/2018, 9:29 AM

## 2018-04-28 NOTE — Anesthesia Postprocedure Evaluation (Signed)
Anesthesia Post Note  Patient: Teresa Bell  Procedure(s) Performed: AN AD HOC LABOR EPIDURAL     Patient location during evaluation: Mother Baby Anesthesia Type: Epidural Level of consciousness: awake and alert Pain management: pain level controlled Vital Signs Assessment: post-procedure vital signs reviewed and stable Respiratory status: spontaneous breathing, nonlabored ventilation and respiratory function stable Cardiovascular status: stable Postop Assessment: no headache, no backache, epidural receding, able to ambulate, adequate PO intake, no apparent nausea or vomiting and patient able to bend at knees Anesthetic complications: no    Last Vitals:  Vitals:   04/28/18 0355 04/28/18 0600  BP: 111/63 (!) 104/52  Pulse: 63 66  Resp: 18 18  Temp: 36.7 C 37 C  SpO2:      Last Pain:  Vitals:   04/28/18 0820  TempSrc:   PainSc: 0-No pain   Pain Goal:                 [employer]

## 2018-04-28 NOTE — Discharge Summary (Signed)
Obstetric Discharge Summary   Patient Name: Teresa Bell DOB: Feb 12, 1986 MRN: [id number]  Date of Admission: 04/27/2018 Date of Discharge: 04/28/2018 Date of Delivery: 04/27/18 Gestational Age at Delivery: [redacted]w[redacted]d  Primary OB: [facility] - Dr. Garwin Brothers  Antepartum complications:  Hypothyroidism Fetal arrhythmia PVCs - normal fetal echo in October 2019 Hx of Cervical incompetence s/p cerclage and removal at 37 weeks  GBS positive  Prenatal Labs:  ABO, Rh: B/Positive/-- (05/23 0000) Antibody: n (05/23 0000) Rubella: Immune (05/23 0000) RPR: Nonreactive (05/23 0000)  HBsAg: Negative (05/23 0000)  HIV: Non-reactive (05/23 0000)  GBS: Positive (10/25 0000)  Admitting Diagnosis: SROM at 40 weeks   Secondary Diagnoses: Patient Active Problem List   Diagnosis Date Noted  . Vacuum-assisted vaginal delivery 04/28/2018  . Second-degree perineal laceration, with delivery 04/28/2018  . Acute blood loss anemia 04/28/2018  . Obstetrical laceration: right sulcus 04/28/2018  . Encounter for elective induction of labor 04/27/2018  . Postpartum care following VAVD (11/26) 10/20/2014  . Hypothyroidism affecting pregnancy in third trimester, antepartum   . Cervical incompetence affecting management of pregnancy in second trimester, antepartum 07/24/2014    Augmentation: Pitocin   Date of Delivery: 04/27/18 Delivered By: Dr. Garwin Brothers Delivery Type: vacuum-assisted vaginal delivery Anesthesia: epidural Placenta: sponatneous Laceration: 2nd degree and right vaginal sulcus  Episiotomy: none  Newborn Data: Live born female  Birth Weight: 9 lb 2.6 oz (4155 g) APGAR: 9, 9  Newborn Delivery   Birth date/time:  04/27/2018 15:14:00 Delivery type:  Vaginal, Vacuum (Extractor)        Hospital/Postpartum Course  (Vaginal Delivery): Pt. Admitted for labor with SROM. She was augmented with Pitocin. See notes and delivery summary for details. Patient had an uncomplicated postpartum course.   By time of discharge on PPD#1, her pain was controlled on oral pain medications; she had appropriate lochia and was ambulating, voiding without difficulty and tolerating regular diet.  She was deemed stable for discharge to home.     Labs: CBC Latest Ref Rng & Units 04/28/2018 04/27/2018 10/20/2014  WBC 4.0 - 10.5 K/uL 7.1 9.4 14.5(H)  Hemoglobin 12.0 - 15.0 g/dL 8.8(L) 11.0(L) 10.4(L)  Hematocrit 36.0 - 46.0 % 26.2(L) 33.8(L) 29.5(L)  Platelets 150 - 400 K/uL 123(L) 184 179   O POS  Physical exam:  BP (!) 104/52 (BP Location: Right Arm)   Pulse 66   Temp 98.6 F (37 C) (Oral)   Resp 18   Ht 5\' 7"  (1.702 m)   Wt 112.2 kg   LMP 07/21/2017 (Exact Date)   SpO2 100%   Breastfeeding? Unknown   BMI 38.72 kg/m  General: alert and no distress Pulm: normal respiratory effort Lochia: appropriate Abdomen: soft, NT Uterine Fundus: firm, below umbilicus Perineum: healing well, mild erythema and edema Extremities: No evidence of DVT seen on physical exam. No lower extremity edema.   Disposition: stable, discharge to home Baby Feeding: breast milk Baby Disposition: home with mom  Contraception: not discussed  Rh Immune globulin given: N/A Rubella vaccine given: N/A Tdap vaccine given in AP or PP setting: UTD Flu vaccine given in AP or PP setting: UTD   Plan:  Teresa Bell was discharged to home in good condition. Follow-up appointment at [facility] in 6 weeks.  Discharge Instructions: Per After Visit Summary. Refer to After Visit Summary and [facility] discharge booklet  Activity: Advance as tolerated. Pelvic rest for 6 weeks.   Diet: Regular, Heart Healthy Discharge Medications: Allergies as of 04/28/2018  Reactions   Amoxicillin Hives   Has patient had a PCN reaction causing immediate rash, facial/tongue/throat swelling, SOB or lightheadedness with hypotension: No Has patient had a PCN reaction causing severe rash involving mucus membranes or skin necrosis:  No Has patient had a PCN reaction that required hospitalization: No Has patient had a PCN reaction occurring within the last 10 years: No If all of the above answers are "NO", then may proceed with Cephalosporin use.      Medication List    TAKE these medications   ferrous sulfate 325 (65 FE) MG tablet Take 1 tablet (325 mg total) by mouth 2 (two) times daily with a meal.   ibuprofen 600 MG tablet Commonly known as:  ADVIL,MOTRIN Take 1 tablet (600 mg total) by mouth every 6 (six) hours.   levothyroxine 112 MCG tablet Commonly known as:  SYNTHROID, LEVOTHROID Take 112 mcg by mouth daily before breakfast.   multivitamin-prenatal 27-0.8 MG Tabs tablet Take 1 tablet by mouth daily at 12 noon.            Discharge Care Instructions  (From admission, onward)         Start     Ordered   04/28/18 0000  Discharge wound care:    Comments:  Warm water sitz baths 3 times daily   04/28/18 1401         Outpatient follow up:  Follow-up Information    Servando Salina, MD. Schedule an appointment as soon as possible for a visit in 6 week(s).   Specialty:  Obstetrics and Gynecology Why:  Postpartum visit Contact information: [address] [place] [address] [phone]           Signed:  Lars Pinks, MSN, CNM [facility]

## 2018-06-09 DIAGNOSIS — Z130 Encounter for screening for diseases of the blood and blood-forming organs and certain disorders involving the immune mechanism: Secondary | ICD-10-CM | POA: Diagnosis not present

## 2018-06-09 DIAGNOSIS — O99285 Endocrine, nutritional and metabolic diseases complicating the puerperium: Secondary | ICD-10-CM | POA: Diagnosis not present

## 2018-07-05 MED FILL — LEVOTHYROXINE 112 MCG TAB: 112 | 30 days supply | Qty: 30 | Fill #0

## 2018-08-13 MED FILL — LEVOTHYROXINE 112 MCG TAB: 112 | 30 days supply | Qty: 30 | Fill #1 | Status: TO

## 2018-09-22 MED FILL — LEVOTHYROXINE 112 MCG TAB: 112 | 30 days supply | Qty: 30 | Fill #0

## 2018-10-16 MED FILL — LEVOTHYROXINE 112 MCG TAB: 112 | 30 days supply | Qty: 30 | Fill #1

## 2018-11-15 MED FILL — LEVOTHYROXINE 112 MCG TAB: 112 | 30 days supply | Qty: 30 | Fill #2

## 2018-12-15 MED FILL — LEVOTHYROXINE 112 MCG TAB: 112 | 30 days supply | Qty: 30 | Fill #3

## 2019-01-04 DIAGNOSIS — Z0000 Encounter for general adult medical examination without abnormal findings: Secondary | ICD-10-CM | POA: Diagnosis not present

## 2019-01-04 DIAGNOSIS — E038 Other specified hypothyroidism: Secondary | ICD-10-CM | POA: Diagnosis not present

## 2019-01-04 DIAGNOSIS — R7301 Impaired fasting glucose: Secondary | ICD-10-CM | POA: Diagnosis not present

## 2019-01-11 DIAGNOSIS — E282 Polycystic ovarian syndrome: Secondary | ICD-10-CM | POA: Diagnosis not present

## 2019-01-11 DIAGNOSIS — Z0000 Encounter for general adult medical examination without abnormal findings: Secondary | ICD-10-CM | POA: Diagnosis not present

## 2019-01-11 DIAGNOSIS — R7301 Impaired fasting glucose: Secondary | ICD-10-CM | POA: Diagnosis not present

## 2019-01-11 DIAGNOSIS — M2550 Pain in unspecified joint: Secondary | ICD-10-CM | POA: Diagnosis not present

## 2019-01-11 DIAGNOSIS — Z1331 Encounter for screening for depression: Secondary | ICD-10-CM | POA: Diagnosis not present

## 2019-01-11 DIAGNOSIS — E039 Hypothyroidism, unspecified: Secondary | ICD-10-CM | POA: Diagnosis not present

## 2019-01-11 DIAGNOSIS — E063 Autoimmune thyroiditis: Secondary | ICD-10-CM | POA: Diagnosis not present

## 2019-01-14 MED FILL — LEVOTHYROXINE 112 MCG TAB: 112 | 30 days supply | Qty: 30 | Fill #0

## 2019-02-08 MED FILL — LEVOTHYROXINE 112 MCG TAB: 112 | 30 days supply | Qty: 30 | Fill #1

## 2019-03-09 MED FILL — LEVOTHYROXINE 112 MCG TAB: 112 | 30 days supply | Qty: 30 | Fill #2

## 2019-04-08 MED FILL — LEVOTHYROXINE 112 MCG TAB: 112 | 30 days supply | Qty: 30 | Fill #3

## 2019-05-09 MED FILL — LEVOTHYROXINE SODIUM 112 MC: 112 | 30 days supply | Qty: 30 | Fill #0

## 2019-06-15 DIAGNOSIS — Z20822 Contact with and (suspected) exposure to covid-19: Secondary | ICD-10-CM | POA: Diagnosis not present

## 2019-07-05 MED FILL — LEVOTHYROXINE SODIUM 112 MC: 112 | 30 days supply | Qty: 30 | Fill #2

## 2019-08-02 MED FILL — LEVOTHYROXINE SODIUM 112 MC: 112 | 30 days supply | Qty: 30 | Fill #3

## 2019-08-16 DIAGNOSIS — Z01419 Encounter for gynecological examination (general) (routine) without abnormal findings: Secondary | ICD-10-CM | POA: Diagnosis not present

## 2019-08-16 DIAGNOSIS — Z1151 Encounter for screening for human papillomavirus (HPV): Secondary | ICD-10-CM | POA: Diagnosis not present

## 2019-08-16 DIAGNOSIS — Z6832 Body mass index (BMI) 32.0-32.9, adult: Secondary | ICD-10-CM | POA: Diagnosis not present

## 2019-08-26 ENCOUNTER — Other Ambulatory Visit: Payer: Self-pay | Admitting: Family Medicine

## 2019-08-26 DIAGNOSIS — E063 Autoimmune thyroiditis: Secondary | ICD-10-CM | POA: Diagnosis not present

## 2019-08-26 DIAGNOSIS — E282 Polycystic ovarian syndrome: Secondary | ICD-10-CM | POA: Insufficient documentation

## 2019-08-29 ENCOUNTER — Telehealth: Payer: Self-pay | Admitting: Family Medicine

## 2019-08-29 LAB — HEMOGLOBIN A1C
Hgb A1c MFr Bld: 5 % of total Hgb (ref <5.7)
Mean Plasma Glucose: 97 (calc)
eAG (mmol/L): 5.4 (calc)

## 2019-08-29 LAB — THYROID PANEL WITH TSH
Free Thyroxine Index: 2.7 (ref 1.4–3.8)
T3 Uptake: 34 % (ref 22–35)
T4, Total: 7.8 ug/dL (ref 5.1–11.9)
TSH: 1.23 mIU/L

## 2019-08-29 LAB — THYROID PEROXIDASE ANTIBODY: Thyroperoxidase Ab SerPl-aCnc: 263 IU/mL — ABNORMAL HIGH (ref <9)

## 2019-08-29 NOTE — Telephone Encounter (Signed)
Thyroid antibodies are very high at 263.    TSH is at a good level.  A1C looks perfect.

## 2019-08-31 MED FILL — LEVOTHYROXINE SODIUM 112 MC: 112 | 30 days supply | Qty: 30 | Fill #4

## 2019-09-30 MED FILL — LEVOTHYROXINE SODIUM 112 MC: 112 | 30 days supply | Qty: 30 | Fill #5

## 2019-11-01 MED FILL — LEVOTHYROXINE SODIUM 112 MC: 112 | 90 days supply | Qty: 90 | Fill #0

## 2019-12-08 ENCOUNTER — Ambulatory Visit (INDEPENDENT_AMBULATORY_CARE_PROVIDER_SITE_OTHER): Payer: 59

## 2019-12-08 ENCOUNTER — Other Ambulatory Visit: Payer: Self-pay | Admitting: Orthopedic Surgery

## 2019-12-08 ENCOUNTER — Ambulatory Visit (INDEPENDENT_AMBULATORY_CARE_PROVIDER_SITE_OTHER): Payer: 59 | Admitting: Orthopedic Surgery

## 2019-12-08 DIAGNOSIS — M545 Low back pain: Secondary | ICD-10-CM

## 2019-12-08 DIAGNOSIS — G8929 Other chronic pain: Secondary | ICD-10-CM

## 2019-12-08 DIAGNOSIS — M5442 Lumbago with sciatica, left side: Secondary | ICD-10-CM

## 2019-12-08 DIAGNOSIS — M5441 Lumbago with sciatica, right side: Secondary | ICD-10-CM

## 2019-12-08 DIAGNOSIS — M25551 Pain in right hip: Secondary | ICD-10-CM

## 2019-12-08 DIAGNOSIS — M25562 Pain in left knee: Secondary | ICD-10-CM

## 2019-12-08 MED ORDER — PREDNISONE 10 MG PO TABS: 20.0000 mg | ORAL_TABLET | Freq: Every day | ORAL | 0 refills | Status: DC

## 2019-12-09 ENCOUNTER — Encounter: Payer: Self-pay | Admitting: Orthopedic Surgery

## 2019-12-09 NOTE — Progress Notes (Signed)
Office Visit Note   Patient: Teresa Bell           Date of Birth: 12-05-1985           MRN: [id number] Visit Date: 12/08/2019              Requested by: Marton Redwood, MD [address] [place],  [place] [address] PCP: Marton Redwood, MD  No chief complaint on file.     HPI: Patient is a 34 year old woman who is having acute lower back pain right hip pain and left knee pain.  She has pain with activities of daily living.  Assessment & Plan: Visit Diagnoses:  1. Chronic bilateral low back pain with bilateral sciatica   2. Acute pain of left knee   3. Pain in right hip   4. Acute bilateral low back pain without sciatica     Plan: We will start a low-dose prednisone 10 mg with breakfast and see how she progresses.  Will need to have follow-up with rheumatology for a complete rheumatologic work-up.  Follow-Up Instructions: No follow-ups on file.   Ortho Exam  Patient is alert, oriented, no adenopathy, well-dressed, normal affect, normal respiratory effort. Examination patient has an antalgic gait there is no redness or cellulitis around the knee she has pain with weightbearing.  Negative sciatic tension signs bilaterally.  No pain with passive range of motion of the joints.  Imaging: XR HIP UNILAT W OR W/O PELVIS 2-3 VIEWS RIGHT  Result Date: 12/09/2019 2 view radiographs of the right hip shows a congruent joint space no avascular necrosis no cystic changes.  XR Knee 1-2 Views Left  Result Date: 12/09/2019 2 view radiographs of the left knee shows congruent joint space no bony abnormalities.  XR Lumbar Spine 2-3 Views  Result Date: 12/09/2019 2 view radiographs of the lumbar spine shows a normal lordosis normal joint spaces no fractures no osteophytic bone spurs.  No images are attached to the encounter.  Labs: Lab Results  Component Value Date   HGBA1C 5.0 08/26/2019   REPTSTATUS 08/24/2014 FINAL 08/23/2014   CULT  08/23/2014    NO GROWTH Note: NO GROUP B STREP  (S.AGALACTIAE) ISOLATED                                                              Culture based screening of vaginal/anorectal swabs at  35 to [redacted] weeks gestation is required to rule out the carriage of Group B Streptococcus. Performed at [facility]      No results found for: ALBUMIN, PREALBUMIN, LABURIC  No results found for: MG No results found for: VD25OH  No results found for: PREALBUMIN CBC EXTENDED Latest Ref Rng & Units 04/28/2018 04/27/2018 10/20/2014  WBC 4.0 - 10.5 K/uL 7.1 9.4 14.5(H)  RBC 3.87 - 5.11 MIL/uL 2.88(L) 3.70(L) 3.23(L)  HGB 12.0 - 15.0 g/dL 8.8(L) 11.0(L) 10.4(L)  HCT 36 - 46 % 26.2(L) 33.8(L) 29.5(L)  PLT 150 - 400 K/uL 123(L) 184 179  NEUTROABS 1.7 - 7.7 K/uL - - -  LYMPHSABS 0.7 - 4.0 K/uL - - -     There is no height or weight on file to calculate BMI.  Orders:  Orders Placed This Encounter  Procedures   XR Lumbar Spine 2-3 Views  XR HIP UNILAT W OR W/O PELVIS 2-3 VIEWS RIGHT   XR Knee 1-2 Views Left   No orders of the defined types were placed in this encounter.    Procedures: No procedures performed  Clinical Data: No additional findings.  ROS:  All other systems negative, except as noted in the HPI. Review of Systems  Objective: Vital Signs: There were no vitals taken for this visit.  Specialty Comments:  No specialty comments available.  PMFS History: Patient Active Problem List   Diagnosis Date Noted   Hashimoto's thyroiditis 08/26/2019   PCOS (polycystic ovarian syndrome) 08/26/2019   Vacuum-assisted vaginal delivery 04/28/2018   Second-degree perineal laceration, with delivery 04/28/2018   Acute blood loss anemia 04/28/2018   Obstetrical laceration: right sulcus 04/28/2018   Encounter for elective induction of labor 04/27/2018   Postpartum care following VAVD (11/26) 10/20/2014   Hypothyroidism affecting pregnancy in third trimester, antepartum    Cervical incompetence affecting management of  pregnancy in second trimester, antepartum 07/24/2014   Past Medical History:  Diagnosis Date   Family history of adverse reaction to anesthesia    mother-- ponv   GERD (gastroesophageal reflux disease)    Hypothyroidism    Incompetent cervix    Polycystic ovary syndrome    Pregnancy     Family History  Problem Relation Age of Onset   Heart disease Mother    Diabetes Mother    Hypertension Father    Cancer Maternal Grandmother        bladder   Diabetes Maternal Grandfather    Heart disease Maternal Grandfather    Cancer Maternal Grandfather        lymphoma   Hypertension Paternal Grandfather    Heart disease Paternal Grandfather     Past Surgical History:  Procedure Laterality Date   CERVICAL CERCLAGE N/A 10/23/2017   Procedure: CERCLAGE CERVICAL;  Surgeon: Servando Salina, MD;  Location: [facility];  Service: Gynecology;  Laterality: N/A;  EDD: 04/27/18   ORIF RIGHT ANKLE FRACTURE  2005   retained hardware   Social History   Occupational History   Not on file  Tobacco Use   Smoking status: Never Smoker   Smokeless tobacco: Never Used  Vaping Use   Vaping Use: Never used  Substance and Sexual Activity   Alcohol use: No   Drug use: No   Sexual activity: Yes    Partners: Male    Birth control/protection: None    Comment: 1ST intercourse- 23, partners- 7, married- 4 yrs

## 2020-01-16 ENCOUNTER — Other Ambulatory Visit: Payer: Self-pay

## 2020-01-16 ENCOUNTER — Ambulatory Visit (INDEPENDENT_AMBULATORY_CARE_PROVIDER_SITE_OTHER): Payer: 59 | Admitting: Family Medicine

## 2020-01-16 ENCOUNTER — Encounter: Payer: Self-pay | Admitting: Family Medicine

## 2020-01-16 DIAGNOSIS — E063 Autoimmune thyroiditis: Secondary | ICD-10-CM | POA: Diagnosis not present

## 2020-01-16 DIAGNOSIS — R439 Unspecified disturbances of smell and taste: Secondary | ICD-10-CM | POA: Diagnosis not present

## 2020-01-16 DIAGNOSIS — Z0000 Encounter for general adult medical examination without abnormal findings: Secondary | ICD-10-CM

## 2020-01-16 NOTE — Progress Notes (Signed)
Office Visit Note   Patient: Teresa Bell           Date of Birth: 23-Aug-1985           MRN: [id number] Visit Date: 01/16/2020 Requested by: Marton Redwood, MD [address] [place],  [place] [address] PCP: Marton Redwood, MD  Subjective: Chief Complaint  Patient presents with  . intermittent taste/smell of ammonia/mustiness    HPI: She is here with smell disturbance.  For the past 2 weeks she has had intermittent feeling that she is smelling ammonia.  It happens randomly and goes away quickly.  She has not had any fevers, head congestion, or any other unusual symptoms.  Denies any dental pain or troubles.  She is not on any supplements and has not been taking any prescriptions other than her thyroid hormone for Hashimoto's.  No family history of autoimmune disorders.  She is due for annual wellness exam in the near future               ROS:   All other systems were reviewed and are negative.  Objective: Vital Signs: There were no vitals taken for this visit.  Physical Exam:  General:  Alert and oriented, in no acute distress. Pulm:  Breathing unlabored. Psy:  Normal mood, congruent affect. Skin: No rash HEENT:  [place]/AT, PERRLA, EOM Full, no nystagmus.  Funduscopic examination within normal limits.  No conjunctival erythema.  Tympanic membranes are pearly gray with normal landmarks.  External ear canals are normal.  Nasal passages are clear.  Oropharynx is clear.  No significant lymphadenopathy.  No thyromegaly or nodules.  2+ carotid pulses without bruits.   Imaging: No results found.  Assessment & Plan: 1.  Smell disturbance, etiology uncertain. -We will draw labs to evaluate.  If labs are all normal, then ENT consult.  2.  Wellness examination -Labs in anticipation of her upcoming appointment.  3.  Hashimoto's -Labs to evaluate.     Procedures: No procedures performed  No notes on file     PMFS History: Patient Active Problem List   Diagnosis Date Noted  .  Hashimoto's thyroiditis 08/26/2019  . PCOS (polycystic ovarian syndrome) 08/26/2019  . Vacuum-assisted vaginal delivery 04/28/2018  . Second-degree perineal laceration, with delivery 04/28/2018  . Acute blood loss anemia 04/28/2018  . Obstetrical laceration: right sulcus 04/28/2018  . Encounter for elective induction of labor 04/27/2018  . Postpartum care following VAVD (11/26) 10/20/2014  . Hypothyroidism affecting pregnancy in third trimester, antepartum   . Cervical incompetence affecting management of pregnancy in second trimester, antepartum 07/24/2014   Past Medical History:  Diagnosis Date  . Family history of adverse reaction to anesthesia    mother-- ponv  . GERD (gastroesophageal reflux disease)   . Hypothyroidism   . Incompetent cervix   . Polycystic ovary syndrome   . Pregnancy     Family History  Problem Relation Age of Onset  . Heart disease Mother   . Diabetes Mother   . Hypertension Father   . Cancer Maternal Grandmother        bladder  . Diabetes Maternal Grandfather   . Heart disease Maternal Grandfather   . Cancer Maternal Grandfather        lymphoma  . Hypertension Paternal Grandfather   . Heart disease Paternal Grandfather     Past Surgical History:  Procedure Laterality Date  . CERVICAL CERCLAGE N/A 10/23/2017   Procedure: CERCLAGE CERVICAL;  Surgeon: Servando Salina, MD;  Location: [facility];  Service: Gynecology;  Laterality: N/A;  EDD: 04/27/18  . ORIF RIGHT ANKLE FRACTURE  2005   retained hardware   Social History   Occupational History  . Not on file  Tobacco Use  . Smoking status: Never Smoker  . Smokeless tobacco: Never Used  Vaping Use  . Vaping Use: Never used  Substance and Sexual Activity  . Alcohol use: No  . Drug use: No  . Sexual activity: Yes    Partners: Male    Birth control/protection: None    Comment: 1ST intercourse- 23, partners- 62, married- 16 yrs

## 2020-01-18 LAB — CBC WITH DIFFERENTIAL/PLATELET
Absolute Monocytes: 350 cells/uL (ref 200–950)
Basophils Absolute: 51 cells/uL (ref 0–200)
Basophils Relative: 1.1 %
Eosinophils Absolute: 161 cells/uL (ref 15–500)
Eosinophils Relative: 3.5 %
HCT: 39.3 % (ref 35.0–45.0)
Hemoglobin: 13.2 g/dL (ref 11.7–15.5)
Lymphs Abs: 1371 cells/uL (ref 850–3900)
MCH: 31.4 pg (ref 27.0–33.0)
MCHC: 33.6 g/dL (ref 32.0–36.0)
MCV: 93.3 fL (ref 80.0–100.0)
MPV: 10.8 fL (ref 7.5–12.5)
Monocytes Relative: 7.6 %
Neutro Abs: 2668 cells/uL (ref 1500–7800)
Neutrophils Relative %: 58 %
Platelets: 198 10*3/uL (ref 140–400)
RBC: 4.21 10*6/uL (ref 3.80–5.10)
RDW: 11.7 % (ref 11.0–15.0)
Total Lymphocyte: 29.8 %
WBC: 4.6 10*3/uL (ref 3.8–10.8)

## 2020-01-18 LAB — THYROID PANEL WITH TSH
Free Thyroxine Index: 2.8 (ref 1.4–3.8)
T3 Uptake: 35 % (ref 22–35)
T4, Total: 8.1 ug/dL (ref 5.1–11.9)
TSH: 0.27 mIU/L — ABNORMAL LOW

## 2020-01-18 LAB — COMPREHENSIVE METABOLIC PANEL
AG Ratio: 2 (calc) (ref 1.0–2.5)
ALT: 20 U/L (ref 6–29)
AST: 19 U/L (ref 10–30)
Albumin: 4.2 g/dL (ref 3.6–5.1)
Alkaline phosphatase (APISO): 85 U/L (ref 31–125)
BUN: 15 mg/dL (ref 7–25)
CO2: 27 mmol/L (ref 20–32)
Calcium: 9.1 mg/dL (ref 8.6–10.2)
Chloride: 105 mmol/L (ref 98–110)
Creat: 0.74 mg/dL (ref 0.50–1.10)
Globulin: 2.1 g/dL (calc) (ref 1.9–3.7)
Glucose, Bld: 86 mg/dL (ref 65–99)
Potassium: 4.4 mmol/L (ref 3.5–5.3)
Sodium: 140 mmol/L (ref 135–146)
Total Bilirubin: 0.6 mg/dL (ref 0.2–1.2)
Total Protein: 6.3 g/dL (ref 6.1–8.1)

## 2020-01-18 LAB — LIPID PANEL
Cholesterol: 178 mg/dL (ref <200)
HDL: 61 mg/dL (ref >=50)
LDL Cholesterol (Calc): 104 mg/dL (calc) — ABNORMAL HIGH
Non-HDL Cholesterol (Calc): 117 mg/dL (calc) (ref <130)
Total CHOL/HDL Ratio: 2.9 (calc) (ref <5.0)
Triglycerides: 43 mg/dL (ref <150)

## 2020-01-18 LAB — C-REACTIVE PROTEIN: CRP: 0.8 mg/L (ref <8.0)

## 2020-01-18 LAB — ANTI-NUCLEAR AB-TITER (ANA TITER)
ANA TITER: 1:80 {titer} — ABNORMAL HIGH
ANA Titer 1: 1:40 {titer} — ABNORMAL HIGH

## 2020-01-18 LAB — VITAMIN D 25 HYDROXY (VIT D DEFICIENCY, FRACTURES): Vit D, 25-Hydroxy: 20 ng/mL — ABNORMAL LOW (ref 30–100)

## 2020-01-18 LAB — ANA: Anti Nuclear Antibody (ANA): POSITIVE — AB

## 2020-01-18 LAB — ZINC: Zinc: 75 ug/dL (ref 60–130)

## 2020-01-18 LAB — THYROID PEROXIDASE ANTIBODY: Thyroperoxidase Ab SerPl-aCnc: 353 IU/mL — ABNORMAL HIGH (ref <9)

## 2020-01-19 ENCOUNTER — Telehealth: Payer: Self-pay | Admitting: Family Medicine

## 2020-01-19 DIAGNOSIS — R439 Unspecified disturbances of smell and taste: Secondary | ICD-10-CM

## 2020-01-19 NOTE — Telephone Encounter (Signed)
Labs show:  Thyroid antibodies are higher than before.  TSH is on the low side.  Might need to decrease dosage slightly.  Vitamin D is low.  Target range is 50-80.  Are you taking it?  If so, how much?  ANA is positive, possibly related to Hashimoto's.  All else looks good.  I don't think the above findings would explain the smell symptoms.

## 2020-01-24 ENCOUNTER — Encounter: Payer: Self-pay | Admitting: Family Medicine

## 2020-01-24 ENCOUNTER — Other Ambulatory Visit: Payer: Self-pay | Admitting: Family Medicine

## 2020-01-24 ENCOUNTER — Other Ambulatory Visit: Payer: Self-pay

## 2020-01-24 ENCOUNTER — Ambulatory Visit (INDEPENDENT_AMBULATORY_CARE_PROVIDER_SITE_OTHER): Payer: 59 | Admitting: Family Medicine

## 2020-01-24 VITALS — BP 95/62 | HR 58 | Ht 65.75 in | Wt 196.8 lb

## 2020-01-24 DIAGNOSIS — E063 Autoimmune thyroiditis: Secondary | ICD-10-CM

## 2020-01-24 DIAGNOSIS — R5382 Chronic fatigue, unspecified: Secondary | ICD-10-CM | POA: Diagnosis not present

## 2020-01-24 DIAGNOSIS — Z0000 Encounter for general adult medical examination without abnormal findings: Secondary | ICD-10-CM | POA: Diagnosis not present

## 2020-01-24 DIAGNOSIS — R768 Other specified abnormal immunological findings in serum: Secondary | ICD-10-CM | POA: Diagnosis not present

## 2020-01-24 DIAGNOSIS — R439 Unspecified disturbances of smell and taste: Secondary | ICD-10-CM

## 2020-01-24 MED ORDER — THYROID 60 MG PO TABS: 60.0000 mg | ORAL_TABLET | Freq: Every day | ORAL | 6 refills | Status: DC

## 2020-01-24 NOTE — Progress Notes (Signed)
Office Visit Note   Patient: Teresa Bell           Date of Birth: 1986/02/08           MRN: [id number] Visit Date: 01/24/2020 Requested by: Marton Redwood, MD [address] [place],  [place] [address] PCP: Marton Redwood, MD  Subjective: Chief Complaint  Patient presents with  . Annual Exam    HPI: She is here for wellness exam. She has Hashimoto's thyroiditis. Recent labs showed that her antibody levels were higher than before. Her TSH was actually a little bit low. She still struggles with chronic fatigue. Her symptoms really worsened in March. Prior to that, she had her first COVID-19 vaccine in January and her second in February. She feels like there might be a connection between the vaccine and her ongoing symptoms.  She has been on Synthroid for a while now. Her symptoms of fatigue persist despite being at therapeutic dosage.  She is up-to-date on eye exams, dental exams, dermatology exams and gynecology exams.  Family history was reviewed in detail.                ROS: Denies any GI dysfunction. No significant musculoskeletal concerns. All other systems were reviewed and are negative.  Objective: Vital Signs: BP 95/62   Pulse (!) 58   Ht 5' 5.75" (1.67 m)   Wt 196 lb 12.8 oz (89.3 kg)   BMI 32.01 kg/m   Physical Exam:  General:  Alert and oriented, in no acute distress. Pulm:  Breathing unlabored. Psy:  Normal mood, congruent affect. Skin: She has several flesh-colored papules on her face which are being monitored by dermatology. No other suspicious lesions seen. HEENT:  [place]/AT, PERRLA, EOM Full, no nystagmus.  Funduscopic examination within normal limits.  No conjunctival erythema.  Tympanic membranes are pearly gray with normal landmarks.  External ear canals are normal.  Nasal passages are clear.  Oropharynx is clear.  No significant lymphadenopathy. Slight thyromegaly with no nodules.  2+ carotid pulses without bruits. CV: Regular rate and rhythm without murmurs,  rubs, or gallops.  No peripheral edema.  2+ radial and posterior tibial pulses. Lungs: Clear to auscultation throughout with no wheezing or areas of consolidation. Abd: Bowel sounds are active, no hepatosplenomegaly or masses.  Soft and nontender.  No audible bruits.  No evidence of ascites. Extremities: No nail deformities. 2+ DTRs.   Imaging: No results found.  Assessment & Plan: 1. Wellness examination -Up-to-date with health maintenance. Recent labs discussed.  2. Hashimoto's thyroiditis -We will switch to NP thyroid. Recheck thyroid panel in about 6 to 8 weeks. -Recheck antibodies in about 3 to 4 months.  3. Chronic fatigue with positive ANA -Related to Covid vaccine. - Consider trying [facility] protocol.  4. Smell disturbance -Seems to have resolved.     Procedures: No procedures performed  No notes on file     PMFS History: Patient Active Problem List   Diagnosis Date Noted  . Hashimoto's thyroiditis 08/26/2019  . PCOS (polycystic ovarian syndrome) 08/26/2019  . Vacuum-assisted vaginal delivery 04/28/2018  . Second-degree perineal laceration, with delivery 04/28/2018  . Acute blood loss anemia 04/28/2018  . Obstetrical laceration: right sulcus 04/28/2018  . Encounter for elective induction of labor 04/27/2018  . Postpartum care following VAVD (11/26) 10/20/2014  . Hypothyroidism affecting pregnancy in third trimester, antepartum   . Cervical incompetence affecting management of pregnancy in second trimester, antepartum 07/24/2014   Past Medical History:  Diagnosis Date  .  Family history of adverse reaction to anesthesia    mother-- ponv  . GERD (gastroesophageal reflux disease)   . Hypothyroidism   . Incompetent cervix   . Polycystic ovary syndrome   . Pregnancy     Family History  Problem Relation Age of Onset  . Heart disease Mother   . Diabetes Mother   . Thyroid disease Mother        Hypothyroid  . Hypertension Father   . Cancer Father   . Skin  cancer Father   . Cancer Maternal Grandmother        bladder  . Diabetes Maternal Grandfather   . Heart disease Maternal Grandfather   . Cancer Maternal Grandfather        lymphoma  . Hypertension Paternal Grandfather   . Heart disease Paternal Grandfather   . Lung cancer Maternal Uncle   . Cancer Maternal Uncle   . Bone cancer Other   . Cancer Other     Past Surgical History:  Procedure Laterality Date  . CERVICAL CERCLAGE N/A 10/23/2017   Procedure: CERCLAGE CERVICAL;  Surgeon: Servando Salina, MD;  Location: [facility];  Service: Gynecology;  Laterality: N/A;  EDD: 04/27/18  . ORIF RIGHT ANKLE FRACTURE  2005   retained hardware   Social History   Occupational History  . Not on file  Tobacco Use  . Smoking status: Never Smoker  . Smokeless tobacco: Never Used  Vaping Use  . Vaping Use: Never used  Substance and Sexual Activity  . Alcohol use: No  . Drug use: No  . Sexual activity: Yes    Partners: Male    Birth control/protection: None    Comment: 1ST intercourse- 23, partners- 73, married- 37 yrs

## 2020-01-25 MED FILL — NP THYROID 60 MG TABLET: 60 | 30 days supply | Qty: 30 | Fill #0

## 2020-01-29 ENCOUNTER — Other Ambulatory Visit: Payer: Self-pay

## 2020-01-29 ENCOUNTER — Ambulatory Visit
Admission: EM | Admit: 2020-01-29 | Discharge: 2020-01-29 | Disposition: A | Discharge status: Home / Self Care | Payer: 59 | Attending: Emergency Medicine | Admitting: Emergency Medicine

## 2020-01-29 ENCOUNTER — Ambulatory Visit: Payer: Self-pay

## 2020-01-29 ENCOUNTER — Encounter: Payer: Self-pay | Admitting: Emergency Medicine

## 2020-01-29 DIAGNOSIS — J0100 Acute maxillary sinusitis, unspecified: Secondary | ICD-10-CM | POA: Diagnosis not present

## 2020-01-29 DIAGNOSIS — R05 Cough: Secondary | ICD-10-CM

## 2020-01-29 DIAGNOSIS — R059 Cough, unspecified: Secondary | ICD-10-CM

## 2020-01-29 DIAGNOSIS — Z1152 Encounter for screening for COVID-19: Secondary | ICD-10-CM

## 2020-01-29 MED ORDER — BENZONATATE 100 MG PO CAPS: 100.0000 mg | ORAL_CAPSULE | Freq: Three times a day (TID) | ORAL | 0 refills | Status: DC

## 2020-01-29 MED ORDER — PREDNISONE 10 MG PO TABS: 20.0000 mg | ORAL_TABLET | Freq: Every day | ORAL | 0 refills | Status: DC

## 2020-01-29 MED ORDER — FLUTICASONE PROPIONATE 50 MCG/ACT NA SUSP: 1.0000 | Freq: Every day | NASAL | 0 refills | Status: DC

## 2020-01-29 MED ORDER — AZITHROMYCIN 250 MG PO TABS: 250.0000 mg | ORAL_TABLET | Freq: Every day | ORAL | 0 refills | Status: DC

## 2020-01-29 NOTE — Discharge Instructions (Signed)
COVID testing ordered.  It will take between 2-7 days for test results.  Someone will contact you regarding abnormal results.    In the meantime: You should remain isolated in your home for 10 days from symptom onset AND greater than 72 hours after symptoms resolution (absence of fever without the use of fever-reducing medication and improvement in respiratory symptoms), whichever is longer Get plenty of rest and push fluids Tessalon Perles prescribed for cough Low-dose prednisone was prescribed  Azithromycin was prescribed for possible sinusitis Flonase for nasal congestion and runny nose, middle ear effusion Use medications daily for symptom relief Use OTC medications like ibuprofen or tylenol as needed fever or pain Call or go to the ED if you have any new or worsening symptoms such as fever, worsening cough, shortness of breath, chest tightness, chest pain, turning blue, changes in mental status, etc..Marland Kitchen

## 2020-01-29 NOTE — ED Triage Notes (Signed)
Sinus pressure and congestion, green nasal discharge.  Pt had covid in January and has had both vaccines.

## 2020-01-29 NOTE — ED Provider Notes (Signed)
[facility]   [id number] 01/29/20 Arrival Time: 0815   CC: COVID symptoms  SUBJECTIVE: History from: patient.  Teresa Bell is a 34 y.o. female who presented to the urgent care with a complaint of sinus pressure, nasal congestion with greenish nasal discharge for the past few days.  Denies sick exposure to COVID, flu or strep.  Denies recent travel.  Has tried OTC medication without relief.  Denies alleviating or aggravating factors.  Reported previous symptoms in the past.   Denies fever, chills, fatigue, sinus pain, rhinorrhea, sore throat, SOB, wheezing, chest pain, nausea, changes in bowel or bladder habits.    ROS: As per HPI.  All other pertinent ROS negative.      Past Medical History:  Diagnosis Date  . Family history of adverse reaction to anesthesia    mother-- ponv  . GERD (gastroesophageal reflux disease)   . Hypothyroidism   . Incompetent cervix   . Polycystic ovary syndrome   . Pregnancy    Past Surgical History:  Procedure Laterality Date  . CERVICAL CERCLAGE N/A 10/23/2017   Procedure: CERCLAGE CERVICAL;  Surgeon: Servando Salina, MD;  Location: [facility];  Service: Gynecology;  Laterality: N/A;  EDD: 04/27/18  . ORIF RIGHT ANKLE FRACTURE  2005   retained hardware   Allergies  Allergen Reactions  . Amoxicillin Hives    Has patient had a PCN reaction causing immediate rash, facial/tongue/throat swelling, SOB or lightheadedness with hypotension: No Has patient had a PCN reaction causing severe rash involving mucus membranes or skin necrosis: No Has patient had a PCN reaction that required hospitalization: No Has patient had a PCN reaction occurring within the last 10 years: No If all of the above answers are "NO", then may proceed with Cephalosporin use.    No current facility-administered medications on file prior to encounter.   Current Outpatient Medications on File Prior to Encounter  Medication Sig Dispense Refill  .  famotidine (PEPCID) 20 MG tablet Take 20 mg by mouth 2 (two) times daily.    Marland Kitchen ibuprofen (ADVIL,MOTRIN) 600 MG tablet Take 1 tablet (600 mg total) by mouth every 6 (six) hours. 30 tablet 0  . thyroid (NP THYROID) 60 MG tablet Take 1 tablet (60 mg total) by mouth daily before breakfast. 30 tablet 6   Social History   Socioeconomic History  . Marital status: Married    Spouse name: Not on file  . Number of children: Not on file  . Years of education: Not on file  . Highest education level: Not on file  Occupational History  . Not on file  Tobacco Use  . Smoking status: Never Smoker  . Smokeless tobacco: Never Used  Vaping Use  . Vaping Use: Never used  Substance and Sexual Activity  . Alcohol use: No  . Drug use: No  . Sexual activity: Yes    Partners: Male    Birth control/protection: None    Comment: 1ST intercourse- 23, partners- 25, married- 8 yrs   Other Topics Concern  . Not on file  Social History Narrative  . Not on file   Social Determinants of Health   Financial Resource Strain:   . Difficulty of Paying Living Expenses: Not on file  Food Insecurity:   . Worried About Charity fundraiser in the Last Year: Not on file  . Ran Out of Food in the Last Year: Not on file  Transportation Needs:   . Lack of Transportation (Medical): Not  on file  . Lack of Transportation (Non-Medical): Not on file  Physical Activity:   . Days of Exercise per Week: Not on file  . Minutes of Exercise per Session: Not on file  Stress:   . Feeling of Stress : Not on file  Social Connections:   . Frequency of Communication with Friends and Family: Not on file  . Frequency of Social Gatherings with Friends and Family: Not on file  . Attends Religious Services: Not on file  . Active Member of Clubs or Organizations: Not on file  . Attends Archivist Meetings: Not on file  . Marital Status: Not on file  Intimate Partner Violence:   . Fear of Current or Ex-Partner: Not on file  .  Emotionally Abused: Not on file  . Physically Abused: Not on file  . Sexually Abused: Not on file   Family History  Problem Relation Age of Onset  . Heart disease Mother   . Diabetes Mother   . Thyroid disease Mother        Hypothyroid  . Hypertension Father   . Cancer Father   . Skin cancer Father   . Cancer Maternal Grandmother        bladder  . Diabetes Maternal Grandfather   . Heart disease Maternal Grandfather   . Cancer Maternal Grandfather        lymphoma  . Hypertension Paternal Grandfather   . Heart disease Paternal Grandfather   . Lung cancer Maternal Uncle   . Cancer Maternal Uncle   . Bone cancer Other   . Cancer Other     OBJECTIVE:  Vitals:   01/29/20 0841 01/29/20 0845  BP:  103/69  Pulse:  (!) 51  Resp:  18  Temp:  98.2 F (36.8 C)  TempSrc:  Oral  SpO2:  98%  Weight: 196 lb 3.4 oz (89 kg)   Height: 5\' 5"  (1.651 m)      General appearance: alert; appears fatigued, but nontoxic; speaking in full sentences and tolerating own secretions HEENT: NCAT; Ears: EACs clear, right TMs pearly gray, left TM with middle ear effusion; Eyes: PERRL.  EOM grossly intact. Sinuses:  maxillary tender; Nose: nares patent without rhinorrhea, Throat: oropharynx clear, tonsils non erythematous or enlarged, uvula midline  Neck: supple without LAD Lungs: unlabored respirations, symmetrical air entry; cough: moderate; no respiratory distress; CTAB Heart: regular rate and rhythm.  Radial pulses 2+ symmetrical bilaterally Skin: warm and dry Psychological: alert and cooperative; normal mood and affect  LABS:  No results found for this or any previous visit (from the past 24 hour(s)).   ASSESSMENT & PLAN:  1. Cough   2. Acute non-recurrent maxillary sinusitis   3. Encounter for screening for COVID-19     Meds ordered this encounter  Medications  . azithromycin (ZITHROMAX) 250 MG tablet    Sig: Take 1 tablet (250 mg total) by mouth daily. Take first 2 tablets together,  then 1 every day until finished.    Dispense:  6 tablet    Refill:  0  . fluticasone (FLONASE) 50 MCG/ACT nasal spray    Sig: Place 1 spray into both nostrils daily for 14 days.    Dispense:  16 g    Refill:  0  . benzonatate (TESSALON) 100 MG capsule    Sig: Take 1 capsule (100 mg total) by mouth every 8 (eight) hours.    Dispense:  30 capsule    Refill:  0  . predniSONE (DELTASONE)  10 MG tablet    Sig: Take 2 tablets (20 mg total) by mouth daily.    Dispense:  15 tablet    Refill:  0    Discharge Instructions   COVID testing ordered.  It will take between 2-7 days for test results.  Someone will contact you regarding abnormal results.    In the meantime: You should remain isolated in your home for 10 days from symptom onset AND greater than 72 hours after symptoms resolution (absence of fever without the use of fever-reducing medication and improvement in respiratory symptoms), whichever is longer Get plenty of rest and push fluids Tessalon Perles prescribed for cough Low-dose prednisone was prescribed  Azithromycin was prescribed for possible sinusitis Flonase for nasal congestion and runny nose, middle ear effusion Use medications daily for symptom relief Use OTC medications like ibuprofen or tylenol as needed fever or pain Call or go to the ED if you have any new or worsening symptoms such as fever, worsening cough, shortness of breath, chest tightness, chest pain, turning blue, changes in mental status, etc...   Reviewed expectations re: course of current medical issues. Questions answered. Outlined signs and symptoms indicating need for more acute intervention. Patient verbalized understanding. After Visit Summary given.      Note: This document was prepared using Dragon voice recognition software and may include unintentional dictation errors.    Emerson Monte, [place] 01/29/20 [phone]

## 2020-01-30 LAB — NOVEL CORONAVIRUS, NAA: SARS-CoV-2, NAA: NOT DETECTED

## 2020-02-28 MED FILL — NP THYROID 60 MG TABLET: 60 | 30 days supply | Qty: 30 | Fill #1

## 2020-03-30 ENCOUNTER — Ambulatory Visit (INDEPENDENT_AMBULATORY_CARE_PROVIDER_SITE_OTHER): Payer: 59 | Admitting: Family Medicine

## 2020-03-30 ENCOUNTER — Other Ambulatory Visit: Payer: Self-pay | Admitting: Family Medicine

## 2020-03-30 ENCOUNTER — Encounter: Payer: Self-pay | Admitting: Family Medicine

## 2020-03-30 ENCOUNTER — Other Ambulatory Visit: Payer: Self-pay

## 2020-03-30 DIAGNOSIS — N6320 Unspecified lump in the left breast, unspecified quadrant: Secondary | ICD-10-CM | POA: Diagnosis not present

## 2020-03-30 NOTE — Progress Notes (Signed)
Office Visit Note   Patient: Teresa Bell           Date of Birth: 12/12/1985           MRN: [id number] Visit Date: 03/30/2020 Requested by: Marton Redwood, MD [address] [place],  [place] [address] PCP: Marton Redwood, MD  Subjective: Chief Complaint  Patient presents with  . small lump, to the side of the left breast    HPI: She is here with a left breast nodule.  About 3 weeks ago she was playing with her kids, had a mild trauma to her chest, and when she felt the area she noticed a small nodule.  She did not think too much of it, and it quit bothering her until a couple days ago when she had another mild contusion which prompted her to feel that area and she noticed the same thing.  It does not seem to be bigger, no fevers, no swollen lymph nodes.  She has had some issues with menstrual cycle irregularities in the past few months.  No family history of breast cancer.              ROS:   All other systems were reviewed and are negative.  Objective: Vital Signs: There were no vitals taken for this visit.  Physical Exam: Female chaperone was present. General:  Alert and oriented, in no acute distress. Pulm:  Breathing unlabored. Psy:  Normal mood, congruent affect. Skin: No bruising or erythema. Left chest wall: The upper outer quadrant of the breast there is a small nodule, feels just a couple millimeters in diameter, which is mobile in the underlying tissue.  No lymphadenopathy in the axillary region.  Imaging: No results found.  Assessment & Plan: 1.  Left upper outer quadrant breast/chest nodule, etiology uncertain. -Ultrasound to evaluate.  If suspicious, then mammogram.     Procedures: No procedures performed  No notes on file     PMFS History: Patient Active Problem List   Diagnosis Date Noted  . Hashimoto's thyroiditis 08/26/2019  . PCOS (polycystic ovarian syndrome) 08/26/2019  . Vacuum-assisted vaginal delivery 04/28/2018  . Second-degree perineal  laceration, with delivery 04/28/2018  . Acute blood loss anemia 04/28/2018  . Obstetrical laceration: right sulcus 04/28/2018  . Encounter for elective induction of labor 04/27/2018  . Postpartum care following VAVD (11/26) 10/20/2014  . Hypothyroidism affecting pregnancy in third trimester, antepartum   . Cervical incompetence affecting management of pregnancy in second trimester, antepartum 07/24/2014   Past Medical History:  Diagnosis Date  . Family history of adverse reaction to anesthesia    mother-- ponv  . GERD (gastroesophageal reflux disease)   . Hypothyroidism   . Incompetent cervix   . Polycystic ovary syndrome   . Pregnancy     Family History  Problem Relation Age of Onset  . Heart disease Mother   . Diabetes Mother   . Thyroid disease Mother        Hypothyroid  . Hypertension Father   . Cancer Father   . Skin cancer Father   . Cancer Maternal Grandmother        bladder  . Diabetes Maternal Grandfather   . Heart disease Maternal Grandfather   . Cancer Maternal Grandfather        lymphoma  . Hypertension Paternal Grandfather   . Heart disease Paternal Grandfather   . Lung cancer Maternal Uncle   . Cancer Maternal Uncle   . Bone cancer Other   .  Cancer Other     Past Surgical History:  Procedure Laterality Date  . CERVICAL CERCLAGE N/A 10/23/2017   Procedure: CERCLAGE CERVICAL;  Surgeon: Servando Salina, MD;  Location: [facility];  Service: Gynecology;  Laterality: N/A;  EDD: 04/27/18  . ORIF RIGHT ANKLE FRACTURE  2005   retained hardware   Social History   Occupational History  . Not on file  Tobacco Use  . Smoking status: Never Smoker  . Smokeless tobacco: Never Used  Vaping Use  . Vaping Use: Never used  Substance and Sexual Activity  . Alcohol use: No  . Drug use: No  . Sexual activity: Yes    Partners: Male    Birth control/protection: None    Comment: 1ST intercourse- 23, partners- 38, married- 28 yrs

## 2020-04-02 ENCOUNTER — Other Ambulatory Visit: Payer: Self-pay

## 2020-04-02 ENCOUNTER — Ambulatory Visit
Admission: RE | Admit: 2020-04-02 | Discharge: 2020-04-02 | Disposition: A | Discharge status: Home / Self Care | Payer: 59 | Source: Ambulatory Visit | Attending: Family Medicine | Admitting: Family Medicine

## 2020-04-02 DIAGNOSIS — N6489 Other specified disorders of breast: Secondary | ICD-10-CM | POA: Diagnosis not present

## 2020-04-02 DIAGNOSIS — N6320 Unspecified lump in the left breast, unspecified quadrant: Secondary | ICD-10-CM

## 2020-04-02 DIAGNOSIS — R928 Other abnormal and inconclusive findings on diagnostic imaging of breast: Secondary | ICD-10-CM | POA: Diagnosis not present

## 2020-04-03 ENCOUNTER — Telehealth: Payer: Self-pay | Admitting: Family Medicine

## 2020-04-03 NOTE — Telephone Encounter (Signed)
Ultrasound and mammogram look good.

## 2020-04-04 MED FILL — NP THYROID 60 MG TABLET: 60 | 30 days supply | Qty: 30 | Fill #2

## 2020-04-11 ENCOUNTER — Other Ambulatory Visit: Payer: 59

## 2020-04-17 ENCOUNTER — Other Ambulatory Visit: Payer: 59

## 2020-05-02 ENCOUNTER — Encounter: Payer: Self-pay | Admitting: Family Medicine

## 2020-05-02 MED ORDER — PREDNISONE 10 MG PO TABS: ORAL_TABLET | ORAL | 0 refills | Status: DC

## 2020-05-02 MED ORDER — SUMATRIPTAN SUCCINATE 100 MG PO TABS: 100.0000 mg | ORAL_TABLET | ORAL | 6 refills | Status: DC | PRN

## 2020-05-02 NOTE — Addendum Note (Signed)
Addended by: Hortencia Pilar on: 05/02/2020 12:56 PM   Modules accepted: Orders

## 2020-05-09 MED FILL — NP THYROID 60 MG TABLET: 60 | 30 days supply | Qty: 30 | Fill #3

## 2020-06-04 MED FILL — NP THYROID 60 MG TABLET: 60 | 30 days supply | Qty: 30 | Fill #4

## 2020-06-15 ENCOUNTER — Other Ambulatory Visit: Payer: Self-pay

## 2020-06-15 ENCOUNTER — Other Ambulatory Visit: Payer: Self-pay | Admitting: Family Medicine

## 2020-06-15 DIAGNOSIS — E063 Autoimmune thyroiditis: Secondary | ICD-10-CM | POA: Diagnosis not present

## 2020-06-15 DIAGNOSIS — R5382 Chronic fatigue, unspecified: Secondary | ICD-10-CM

## 2020-06-15 DIAGNOSIS — R739 Hyperglycemia, unspecified: Secondary | ICD-10-CM

## 2020-06-15 DIAGNOSIS — R768 Other specified abnormal immunological findings in serum: Secondary | ICD-10-CM

## 2020-06-15 DIAGNOSIS — E559 Vitamin D deficiency, unspecified: Secondary | ICD-10-CM

## 2020-06-18 LAB — THYROID PEROXIDASE ANTIBODY: Thyroperoxidase Ab SerPl-aCnc: 596 IU/mL — ABNORMAL HIGH (ref <9)

## 2020-06-18 LAB — BASIC METABOLIC PANEL
BUN: 13 mg/dL (ref 7–25)
CO2: 26 mmol/L (ref 20–32)
Calcium: 9.2 mg/dL (ref 8.6–10.2)
Chloride: 104 mmol/L (ref 98–110)
Creat: 0.82 mg/dL (ref 0.50–1.10)
Glucose, Bld: 73 mg/dL (ref 65–99)
Potassium: 4.3 mmol/L (ref 3.5–5.3)
Sodium: 139 mmol/L (ref 135–146)

## 2020-06-18 LAB — THYROID PANEL WITH TSH
Free Thyroxine Index: 0.7 — ABNORMAL LOW (ref 1.4–3.8)
T3 Uptake: 29 % (ref 22–35)
T4, Total: 2.4 ug/dL — ABNORMAL LOW (ref 5.1–11.9)
TSH: 91.55 mIU/L — ABNORMAL HIGH

## 2020-06-18 LAB — HEMOGLOBIN A1C
Hgb A1c MFr Bld: 5 % of total Hgb (ref <5.7)
Mean Plasma Glucose: 97 mg/dL
eAG (mmol/L): 5.4 mmol/L

## 2020-06-18 LAB — CBC WITH DIFFERENTIAL/PLATELET
Absolute Monocytes: 358 cells/uL (ref 200–950)
Basophils Absolute: 72 cells/uL (ref 0–200)
Basophils Relative: 1.1 %
Eosinophils Absolute: 332 cells/uL (ref 15–500)
Eosinophils Relative: 5.1 %
HCT: 37.8 % (ref 35.0–45.0)
Hemoglobin: 13.1 g/dL (ref 11.7–15.5)
Lymphs Abs: 1976 cells/uL (ref 850–3900)
MCH: 32.1 pg (ref 27.0–33.0)
MCHC: 34.7 g/dL (ref 32.0–36.0)
MCV: 92.6 fL (ref 80.0–100.0)
MPV: 11.1 fL (ref 7.5–12.5)
Monocytes Relative: 5.5 %
Neutro Abs: 3764 cells/uL (ref 1500–7800)
Neutrophils Relative %: 57.9 %
Platelets: 251 10*3/uL (ref 140–400)
RBC: 4.08 10*6/uL (ref 3.80–5.10)
RDW: 11.7 % (ref 11.0–15.0)
Total Lymphocyte: 30.4 %
WBC: 6.5 10*3/uL (ref 3.8–10.8)

## 2020-06-18 LAB — ANTI-NUCLEAR AB-TITER (ANA TITER)
ANA TITER: 1:80 {titer} — ABNORMAL HIGH
ANA Titer 1: 1:40 {titer} — ABNORMAL HIGH

## 2020-06-18 LAB — ANA: Anti Nuclear Antibody (ANA): POSITIVE — AB

## 2020-06-18 LAB — VITAMIN D 25 HYDROXY (VIT D DEFICIENCY, FRACTURES): Vit D, 25-Hydroxy: 21 ng/mL — ABNORMAL LOW (ref 30–100)

## 2020-06-19 ENCOUNTER — Telehealth: Payer: Self-pay | Admitting: Family Medicine

## 2020-06-19 DIAGNOSIS — E063 Autoimmune thyroiditis: Secondary | ICD-10-CM

## 2020-06-19 MED ORDER — THYROID 90 MG PO TABS: 90.0000 mg | ORAL_TABLET | Freq: Every day | ORAL | 6 refills | Status: DC

## 2020-06-19 NOTE — Telephone Encounter (Signed)
ANA still the same, mildly positive.  Vitamin D low at 21 (goal is 50-80).  What is current dosage of D?  Thyroid antibodies are higher at 596.  TSH is very high at 91.  Current dosage of NP Thyroid is 60 mg, correct?  Others looked good.

## 2020-06-19 NOTE — Addendum Note (Signed)
Addended by: Hortencia Pilar on: 06/19/2020 01:00 PM   Modules accepted: Orders

## 2020-07-05 ENCOUNTER — Ambulatory Visit
Admission: RE | Admit: 2020-07-05 | Discharge: 2020-07-05 | Disposition: A | Discharge status: Home / Self Care | Payer: 59 | Source: Ambulatory Visit | Attending: Family Medicine | Admitting: Family Medicine

## 2020-07-05 DIAGNOSIS — E063 Autoimmune thyroiditis: Secondary | ICD-10-CM

## 2020-07-06 ENCOUNTER — Telehealth: Payer: Self-pay | Admitting: Family Medicine

## 2020-07-06 NOTE — Telephone Encounter (Signed)
Thyroid ultrasound findings are consistent with Hashimoto's.  No worrisome findings.

## 2020-07-26 ENCOUNTER — Other Ambulatory Visit: Payer: Self-pay | Admitting: Family Medicine

## 2020-07-26 ENCOUNTER — Encounter: Payer: Self-pay | Admitting: Family Medicine

## 2020-07-26 MED ORDER — THYROID 90 MG PO TABS: 90.0000 mg | ORAL_TABLET | Freq: Every day | ORAL | 6 refills | Status: DC

## 2020-07-26 MED FILL — NP THYROID 90 MG TABLET: 90 | 30 days supply | Qty: 30 | Fill #0

## 2020-08-20 DIAGNOSIS — H5203 Hypermetropia, bilateral: Secondary | ICD-10-CM | POA: Diagnosis not present

## 2020-08-20 DIAGNOSIS — H52223 Regular astigmatism, bilateral: Secondary | ICD-10-CM | POA: Diagnosis not present

## 2020-08-28 MED FILL — NP THYROID 90 MG TABLET: 90 | 30 days supply | Qty: 30 | Fill #1

## 2020-09-03 DIAGNOSIS — D2261 Melanocytic nevi of right upper limb, including shoulder: Secondary | ICD-10-CM | POA: Diagnosis not present

## 2020-09-03 DIAGNOSIS — D485 Neoplasm of uncertain behavior of skin: Secondary | ICD-10-CM | POA: Diagnosis not present

## 2020-09-03 DIAGNOSIS — D2239 Melanocytic nevi of other parts of face: Secondary | ICD-10-CM | POA: Diagnosis not present

## 2020-09-10 ENCOUNTER — Other Ambulatory Visit: Payer: Self-pay

## 2020-09-10 ENCOUNTER — Telehealth: Payer: Self-pay

## 2020-09-10 DIAGNOSIS — E063 Autoimmune thyroiditis: Secondary | ICD-10-CM

## 2020-09-10 NOTE — Telephone Encounter (Signed)
TSH drawn - recheck from 06/2020 - per Dr. Junius Roads. The patient said her symptoms have not changed - she is still very fatigued.

## 2020-09-11 ENCOUNTER — Telehealth: Payer: Self-pay | Admitting: Family Medicine

## 2020-09-11 LAB — TSH: TSH: 1.85 mIU/L

## 2020-09-11 NOTE — Telephone Encounter (Signed)
TSH is perfect at 1.85.

## 2020-09-27 ENCOUNTER — Ambulatory Visit: Payer: 59 | Admitting: Obstetrics & Gynecology

## 2020-09-27 DIAGNOSIS — Z0289 Encounter for other administrative examinations: Secondary | ICD-10-CM

## 2020-10-03 ENCOUNTER — Other Ambulatory Visit (HOSPITAL_COMMUNITY): Payer: Self-pay

## 2020-10-03 MED FILL — Thyroid Tab 90 MG (1 1/2 Grain): ORAL | 30 days supply | Qty: 30 | Fill #0 | Status: AC

## 2020-11-06 ENCOUNTER — Other Ambulatory Visit: Payer: Self-pay

## 2020-11-06 ENCOUNTER — Encounter: Payer: Self-pay | Admitting: Family Medicine

## 2020-11-06 DIAGNOSIS — E063 Autoimmune thyroiditis: Secondary | ICD-10-CM | POA: Diagnosis not present

## 2020-11-06 DIAGNOSIS — R5382 Chronic fatigue, unspecified: Secondary | ICD-10-CM

## 2020-11-07 ENCOUNTER — Telehealth: Payer: Self-pay | Admitting: Family Medicine

## 2020-11-07 LAB — TSH: TSH: 3.23 mIU/L

## 2020-11-07 LAB — CBC WITH DIFFERENTIAL/PLATELET
Absolute Monocytes: 267 cells/uL (ref 200–950)
Basophils Absolute: 41 cells/uL (ref 0–200)
Basophils Relative: 0.9 %
Eosinophils Absolute: 147 cells/uL (ref 15–500)
Eosinophils Relative: 3.2 %
HCT: 37 % (ref 35.0–45.0)
Hemoglobin: 12.4 g/dL (ref 11.7–15.5)
Lymphs Abs: 1449 cells/uL (ref 850–3900)
MCH: 31.4 pg (ref 27.0–33.0)
MCHC: 33.5 g/dL (ref 32.0–36.0)
MCV: 93.7 fL (ref 80.0–100.0)
MPV: 10.5 fL (ref 7.5–12.5)
Monocytes Relative: 5.8 %
Neutro Abs: 2696 cells/uL (ref 1500–7800)
Neutrophils Relative %: 58.6 %
Platelets: 210 10*3/uL (ref 140–400)
RBC: 3.95 10*6/uL (ref 3.80–5.10)
RDW: 11.7 % (ref 11.0–15.0)
Total Lymphocyte: 31.5 %
WBC: 4.6 10*3/uL (ref 3.8–10.8)

## 2020-11-07 LAB — IRON,TIBC AND FERRITIN PANEL
%SAT: 31 % (calc) (ref 16–45)
Ferritin: 26 ng/mL (ref 16–154)
Iron: 85 ug/dL (ref 40–190)
TIBC: 274 mcg/dL (calc) (ref 250–450)

## 2020-11-07 NOTE — Telephone Encounter (Signed)
TSH has crept up slightly to 3.23 (target is 0.5-1.0).  This could be contributing to symptoms.    Ferritin is slightly low, suggesting mild iron deficiency.  Would suggest increasing intake of iron-rich foods, or possibly taking a once daily OTC iron supplement.

## 2020-11-08 ENCOUNTER — Other Ambulatory Visit (HOSPITAL_COMMUNITY): Payer: Self-pay

## 2020-11-08 ENCOUNTER — Other Ambulatory Visit: Payer: Self-pay | Admitting: Family Medicine

## 2020-11-08 MED ORDER — THYROID 120 MG PO TABS
120.0000 mg | ORAL_TABLET | Freq: Every day | ORAL | 6 refills | Status: DC
  Filled 2020-11-08: qty 30, 30d supply, fill #0

## 2020-11-08 MED ORDER — THYROID 113.75 MG PO TABS: 113.7500 mg | ORAL_TABLET | Freq: Every day | ORAL | 6 refills | Status: DC

## 2020-11-08 MED ORDER — THYROID 113.75 MG PO TABS
1.0000 | ORAL_TABLET | Freq: Every day | ORAL | 6 refills | Status: DC
  Filled 2020-11-08: qty 30, 30d supply, fill #0

## 2020-11-08 NOTE — Addendum Note (Signed)
Addended by: Hortencia Pilar on: 11/08/2020 08:12 AM   Modules accepted: Orders

## 2020-11-08 NOTE — Progress Notes (Signed)
Dosage adjusted.

## 2020-11-11 ENCOUNTER — Encounter: Payer: Self-pay | Admitting: Family Medicine

## 2020-11-11 DIAGNOSIS — M2550 Pain in unspecified joint: Secondary | ICD-10-CM

## 2020-11-12 NOTE — Addendum Note (Signed)
Addended by: Hortencia Pilar on: 11/12/2020 08:16 AM   Modules accepted: Orders

## 2020-11-13 ENCOUNTER — Encounter: Payer: Self-pay | Admitting: Family Medicine

## 2020-11-13 MED ORDER — LIOTHYRONINE SODIUM 5 MCG PO TABS: 5.0000 ug | ORAL_TABLET | Freq: Every day | ORAL | 6 refills | Status: DC

## 2020-11-13 MED ORDER — LEVOTHYROXINE SODIUM 100 MCG PO TABS: 100.0000 ug | ORAL_TABLET | Freq: Every day | ORAL | 6 refills | Status: DC

## 2020-11-13 NOTE — Addendum Note (Signed)
Addended by: Hortencia Pilar on: 11/13/2020 01:05 PM   Modules accepted: Orders

## 2020-12-07 ENCOUNTER — Ambulatory Visit (INDEPENDENT_AMBULATORY_CARE_PROVIDER_SITE_OTHER): Payer: 59 | Admitting: Obstetrics & Gynecology

## 2020-12-07 ENCOUNTER — Other Ambulatory Visit (HOSPITAL_COMMUNITY)
Admission: RE | Admit: 2020-12-07 | Discharge: 2020-12-07 | Disposition: A | Discharge status: Home / Self Care | Payer: 59 | Source: Ambulatory Visit | Attending: Obstetrics & Gynecology | Admitting: Obstetrics & Gynecology

## 2020-12-07 ENCOUNTER — Encounter: Payer: Self-pay | Admitting: Obstetrics & Gynecology

## 2020-12-07 ENCOUNTER — Other Ambulatory Visit: Payer: Self-pay

## 2020-12-07 ENCOUNTER — Other Ambulatory Visit (HOSPITAL_COMMUNITY): Payer: Self-pay

## 2020-12-07 VITALS — BP 104/80 | HR 68 | Resp 16 | Ht 65.25 in | Wt 203.0 lb

## 2020-12-07 DIAGNOSIS — Z01419 Encounter for gynecological examination (general) (routine) without abnormal findings: Secondary | ICD-10-CM

## 2020-12-07 DIAGNOSIS — Z30011 Encounter for initial prescription of contraceptive pills: Secondary | ICD-10-CM | POA: Diagnosis not present

## 2020-12-07 DIAGNOSIS — E063 Autoimmune thyroiditis: Secondary | ICD-10-CM | POA: Diagnosis not present

## 2020-12-07 DIAGNOSIS — F3281 Premenstrual dysphoric disorder: Secondary | ICD-10-CM

## 2020-12-07 MED ORDER — NORETHINDRONE 0.35 MG PO TABS
1.0000 | ORAL_TABLET | Freq: Every day | ORAL | 4 refills | Status: DC
  Filled 2020-12-07: qty 84, 84d supply, fill #0

## 2020-12-07 NOTE — Progress Notes (Signed)
Teresa Bell [birth year] [id number]   History:    35 y.o. G2P2L2 Married.  Vasectomy.  Daughter is 64 yo doing very well.  Son is 2 1/2 yo.   RP:  Established patient presenting for annual gyn exam   HPI:  Menses regular normal flow every month.  PMDD.  No BTB.  No pelvic pain.  Breasts wnl.  Good fitness.  BMI 33.52.  Hashimoto Thyroiditis difficult to control.  On Levothyroxine currently.  Had increased liver enzymes on BCPs previously.     Gynecologic History Patient's last menstrual period was 11/22/2020 (exact date).  Obstetric History OB History  Gravida Para Term Preterm AB Living  2 2 2     2   SAB IAB Ectopic Multiple Live Births        0 2    # Outcome Date GA Lbr Len/2nd Weight Sex Delivery Anes PTL Lv  2 Term 04/27/18 [redacted]w[redacted]d 07:13 / 04:31 9 lb 2.6 oz (4.155 kg) M Vag-Vacuum EPI  LIV  1 Term 10/19/14 [redacted]w[redacted]d 04:25 / 00:18 7 lb 15.5 oz (3.615 kg) F Vag-Spont EPI  LIV     Birth Comments: None     ROS: A ROS was performed and pertinent positives and negatives are included in the history.  GENERAL: No fevers or chills. HEENT: No change in vision, no earache, sore throat or sinus congestion. NECK: No pain or stiffness. CARDIOVASCULAR: No chest pain or pressure. No palpitations. PULMONARY: No shortness of breath, cough or wheeze. GASTROINTESTINAL: No abdominal pain, nausea, vomiting or diarrhea, melena or bright red blood per rectum. GENITOURINARY: No urinary frequency, urgency, hesitancy or dysuria. MUSCULOSKELETAL: No joint or muscle pain, no back pain, no recent trauma. DERMATOLOGIC: No rash, no itching, no lesions. ENDOCRINE: No polyuria, polydipsia, no heat or cold intolerance. No recent change in weight. HEMATOLOGICAL: No anemia or easy bruising or bleeding. NEUROLOGIC: No headache, seizures, numbness, tingling or weakness. PSYCHIATRIC: No depression, no loss of interest in normal activity or change in sleep pattern.     Exam:   BP 104/80   Pulse 68   Resp 16   Ht 5'  5.25" (1.657 m)   Wt 203 lb (92.1 kg)   LMP 11/22/2020 (Exact Date)   BMI 33.52 kg/m   Body mass index is 33.52 kg/m.  General appearance : Well developed well nourished female. No acute distress HEENT: Eyes: no retinal hemorrhage or exudates,  Neck supple, trachea midline, no carotid bruits, no thyroidmegaly Lungs: Clear to auscultation, no rhonchi or wheezes, or rib retractions  Heart: Regular rate and rhythm, no murmurs or gallops Breast:Examined in sitting and supine position were symmetrical in appearance, no palpable masses or tenderness,  no skin retraction, no nipple inversion, no nipple discharge, no skin discoloration, no axillary or supraclavicular lymphadenopathy Abdomen: no palpable masses or tenderness, no rebound or guarding Extremities: no edema or skin discoloration or tenderness  Pelvic: Vulva: Normal             Vagina: No gross lesions or discharge  Cervix: No gross lesions or discharge.  Pap reflex done.  Uterus  AV, normal size, shape and consistency, non-tender and mobile  Adnexa  Without masses or tenderness  Anus: Normal   Assessment/Plan:  35 y.o. female for annual exam   1. Encounter for routine gynecological examination with Papanicolaou smear of cervix Normal gynecologic exam.  Pap reflex done.  Breast exam normal.  Body mass index 33.52.  Hashimoto's thyroiditis and hypothyroidism currently.  Adjusting  levothyroxine with endocrinologist.  Recommend a lower calorie/carb diet and intermittent fasting.  Aerobic activities and light weightlifting recommended. - Cytology - PAP( [place])  2. Encounter for initial prescription of contraceptive pills Decision to start on the progestin only pill to block ovulation and decrease PMDD.  Contraception assured with vasectomy.  3. PMDD (premenstrual dysphoric disorder) Will start on the progestin only birth control pill to control PMDD.  No contraindication.  Usage reviewed and prescription sent to  pharmacy.  4. Hashimoto's thyroiditis Continue to improve treatment of hypothyroidism secondary to Hashimoto's thyroiditis with levothyroxine.  Other orders - norethindrone (MICRONOR) 0.35 MG tablet; Take 1 tablet (0.35 mg total) by mouth daily.   Princess Bruins MD, 2:12 PM 12/07/2020

## 2020-12-13 LAB — CYTOLOGY - PAP: Diagnosis: NEGATIVE

## 2020-12-17 ENCOUNTER — Other Ambulatory Visit (HOSPITAL_COMMUNITY): Payer: Self-pay

## 2020-12-17 ENCOUNTER — Encounter: Payer: Self-pay | Admitting: Family Medicine

## 2020-12-17 MED ORDER — LEVOTHYROXINE SODIUM 100 MCG PO TABS
100.0000 ug | ORAL_TABLET | Freq: Every day | ORAL | 6 refills | Status: DC
  Filled 2020-12-17: qty 30, 30d supply, fill #0
  Filled 2021-01-17: qty 30, 30d supply, fill #1
  Filled 2021-02-21: qty 30, 30d supply, fill #2
  Filled 2021-03-31: qty 30, 30d supply, fill #3
  Filled 2021-04-22: qty 30, 30d supply, fill #4
  Filled 2021-06-04: qty 30, 30d supply, fill #5
  Filled 2021-07-11: qty 30, 30d supply, fill #6

## 2020-12-17 MED ORDER — LIOTHYRONINE SODIUM 5 MCG PO TABS
5.0000 ug | ORAL_TABLET | Freq: Every day | ORAL | 6 refills | Status: DC
  Filled 2020-12-17: qty 30, 30d supply, fill #0
  Filled 2021-01-17: qty 30, 30d supply, fill #1
  Filled 2021-02-21: qty 30, 30d supply, fill #2
  Filled 2021-03-31: qty 30, 30d supply, fill #3
  Filled 2021-04-22: qty 30, 30d supply, fill #4
  Filled 2021-06-04: qty 30, 30d supply, fill #5

## 2020-12-21 DIAGNOSIS — M545 Low back pain, unspecified: Secondary | ICD-10-CM | POA: Diagnosis not present

## 2020-12-21 DIAGNOSIS — R768 Other specified abnormal immunological findings in serum: Secondary | ICD-10-CM | POA: Diagnosis not present

## 2020-12-21 DIAGNOSIS — M2550 Pain in unspecified joint: Secondary | ICD-10-CM | POA: Diagnosis not present

## 2020-12-21 DIAGNOSIS — Z6832 Body mass index (BMI) 32.0-32.9, adult: Secondary | ICD-10-CM | POA: Diagnosis not present

## 2020-12-21 DIAGNOSIS — R5383 Other fatigue: Secondary | ICD-10-CM | POA: Diagnosis not present

## 2020-12-21 DIAGNOSIS — E063 Autoimmune thyroiditis: Secondary | ICD-10-CM | POA: Diagnosis not present

## 2020-12-21 DIAGNOSIS — E669 Obesity, unspecified: Secondary | ICD-10-CM | POA: Diagnosis not present

## 2021-01-18 ENCOUNTER — Other Ambulatory Visit (HOSPITAL_COMMUNITY): Payer: Self-pay

## 2021-01-31 DIAGNOSIS — R7301 Impaired fasting glucose: Secondary | ICD-10-CM | POA: Diagnosis not present

## 2021-01-31 DIAGNOSIS — R001 Bradycardia, unspecified: Secondary | ICD-10-CM | POA: Diagnosis not present

## 2021-01-31 DIAGNOSIS — Z0000 Encounter for general adult medical examination without abnormal findings: Secondary | ICD-10-CM | POA: Diagnosis not present

## 2021-01-31 DIAGNOSIS — E039 Hypothyroidism, unspecified: Secondary | ICD-10-CM | POA: Diagnosis not present

## 2021-02-07 DIAGNOSIS — R768 Other specified abnormal immunological findings in serum: Secondary | ICD-10-CM | POA: Diagnosis not present

## 2021-02-07 DIAGNOSIS — M25542 Pain in joints of left hand: Secondary | ICD-10-CM | POA: Diagnosis not present

## 2021-02-07 DIAGNOSIS — E039 Hypothyroidism, unspecified: Secondary | ICD-10-CM | POA: Diagnosis not present

## 2021-02-07 DIAGNOSIS — Z1331 Encounter for screening for depression: Secondary | ICD-10-CM | POA: Diagnosis not present

## 2021-02-07 DIAGNOSIS — R82998 Other abnormal findings in urine: Secondary | ICD-10-CM | POA: Diagnosis not present

## 2021-02-07 DIAGNOSIS — Z0000 Encounter for general adult medical examination without abnormal findings: Secondary | ICD-10-CM | POA: Diagnosis not present

## 2021-02-07 DIAGNOSIS — Z1389 Encounter for screening for other disorder: Secondary | ICD-10-CM | POA: Diagnosis not present

## 2021-02-07 DIAGNOSIS — E282 Polycystic ovarian syndrome: Secondary | ICD-10-CM | POA: Diagnosis not present

## 2021-02-07 DIAGNOSIS — R7301 Impaired fasting glucose: Secondary | ICD-10-CM | POA: Diagnosis not present

## 2021-02-07 DIAGNOSIS — E063 Autoimmune thyroiditis: Secondary | ICD-10-CM | POA: Diagnosis not present

## 2021-02-07 DIAGNOSIS — M25541 Pain in joints of right hand: Secondary | ICD-10-CM | POA: Diagnosis not present

## 2021-02-07 DIAGNOSIS — E669 Obesity, unspecified: Secondary | ICD-10-CM | POA: Diagnosis not present

## 2021-02-21 ENCOUNTER — Other Ambulatory Visit (HOSPITAL_COMMUNITY): Payer: Self-pay

## 2021-04-01 ENCOUNTER — Other Ambulatory Visit (HOSPITAL_COMMUNITY): Payer: Self-pay

## 2021-04-23 ENCOUNTER — Other Ambulatory Visit (HOSPITAL_COMMUNITY): Payer: Self-pay

## 2021-04-24 ENCOUNTER — Other Ambulatory Visit (HOSPITAL_COMMUNITY): Payer: Self-pay

## 2021-06-05 ENCOUNTER — Other Ambulatory Visit (HOSPITAL_COMMUNITY): Payer: Self-pay

## 2021-06-18 ENCOUNTER — Other Ambulatory Visit (HOSPITAL_COMMUNITY): Payer: Self-pay

## 2021-06-18 DIAGNOSIS — E039 Hypothyroidism, unspecified: Secondary | ICD-10-CM | POA: Diagnosis not present

## 2021-06-18 DIAGNOSIS — E8881 Metabolic syndrome: Secondary | ICD-10-CM | POA: Diagnosis not present

## 2021-06-18 DIAGNOSIS — E282 Polycystic ovarian syndrome: Secondary | ICD-10-CM | POA: Diagnosis not present

## 2021-06-18 DIAGNOSIS — R7301 Impaired fasting glucose: Secondary | ICD-10-CM | POA: Diagnosis not present

## 2021-06-18 DIAGNOSIS — E669 Obesity, unspecified: Secondary | ICD-10-CM | POA: Diagnosis not present

## 2021-06-18 MED ORDER — METFORMIN HCL 500 MG PO TABS
500.0000 mg | ORAL_TABLET | Freq: Two times a day (BID) | ORAL | 11 refills | Status: DC
  Filled 2021-06-18: qty 60, 30d supply, fill #0

## 2021-07-02 DIAGNOSIS — E7800 Pure hypercholesterolemia, unspecified: Secondary | ICD-10-CM | POA: Diagnosis not present

## 2021-07-02 DIAGNOSIS — Z6831 Body mass index (BMI) 31.0-31.9, adult: Secondary | ICD-10-CM | POA: Diagnosis not present

## 2021-07-02 DIAGNOSIS — E063 Autoimmune thyroiditis: Secondary | ICD-10-CM | POA: Diagnosis not present

## 2021-07-02 DIAGNOSIS — N951 Menopausal and female climacteric states: Secondary | ICD-10-CM | POA: Diagnosis not present

## 2021-07-02 DIAGNOSIS — E559 Vitamin D deficiency, unspecified: Secondary | ICD-10-CM | POA: Diagnosis not present

## 2021-07-02 DIAGNOSIS — R635 Abnormal weight gain: Secondary | ICD-10-CM | POA: Diagnosis not present

## 2021-07-09 DIAGNOSIS — E039 Hypothyroidism, unspecified: Secondary | ICD-10-CM | POA: Diagnosis not present

## 2021-07-09 DIAGNOSIS — N951 Menopausal and female climacteric states: Secondary | ICD-10-CM | POA: Diagnosis not present

## 2021-07-09 DIAGNOSIS — Z6831 Body mass index (BMI) 31.0-31.9, adult: Secondary | ICD-10-CM | POA: Diagnosis not present

## 2021-07-09 DIAGNOSIS — E7800 Pure hypercholesterolemia, unspecified: Secondary | ICD-10-CM | POA: Diagnosis not present

## 2021-07-09 DIAGNOSIS — R635 Abnormal weight gain: Secondary | ICD-10-CM | POA: Diagnosis not present

## 2021-07-09 DIAGNOSIS — E063 Autoimmune thyroiditis: Secondary | ICD-10-CM | POA: Diagnosis not present

## 2021-07-09 DIAGNOSIS — Z1339 Encounter for screening examination for other mental health and behavioral disorders: Secondary | ICD-10-CM | POA: Diagnosis not present

## 2021-07-09 DIAGNOSIS — R768 Other specified abnormal immunological findings in serum: Secondary | ICD-10-CM | POA: Diagnosis not present

## 2021-07-09 DIAGNOSIS — Z1331 Encounter for screening for depression: Secondary | ICD-10-CM | POA: Diagnosis not present

## 2021-07-11 ENCOUNTER — Other Ambulatory Visit (HOSPITAL_COMMUNITY): Payer: Self-pay

## 2021-07-18 DIAGNOSIS — E7800 Pure hypercholesterolemia, unspecified: Secondary | ICD-10-CM | POA: Diagnosis not present

## 2021-07-18 DIAGNOSIS — Z6831 Body mass index (BMI) 31.0-31.9, adult: Secondary | ICD-10-CM | POA: Diagnosis not present

## 2021-07-24 DIAGNOSIS — E063 Autoimmune thyroiditis: Secondary | ICD-10-CM | POA: Diagnosis not present

## 2021-07-24 DIAGNOSIS — Z6830 Body mass index (BMI) 30.0-30.9, adult: Secondary | ICD-10-CM | POA: Diagnosis not present

## 2021-07-29 DIAGNOSIS — Z6830 Body mass index (BMI) 30.0-30.9, adult: Secondary | ICD-10-CM | POA: Diagnosis not present

## 2021-07-29 DIAGNOSIS — E7800 Pure hypercholesterolemia, unspecified: Secondary | ICD-10-CM | POA: Diagnosis not present

## 2021-08-02 ENCOUNTER — Encounter: Payer: Self-pay | Admitting: Surgical

## 2021-08-02 ENCOUNTER — Ambulatory Visit (INDEPENDENT_AMBULATORY_CARE_PROVIDER_SITE_OTHER): Payer: 59

## 2021-08-02 ENCOUNTER — Other Ambulatory Visit: Payer: Self-pay

## 2021-08-02 ENCOUNTER — Ambulatory Visit (INDEPENDENT_AMBULATORY_CARE_PROVIDER_SITE_OTHER): Payer: 59 | Admitting: Surgical

## 2021-08-02 DIAGNOSIS — M546 Pain in thoracic spine: Secondary | ICD-10-CM

## 2021-08-05 DIAGNOSIS — Z6830 Body mass index (BMI) 30.0-30.9, adult: Secondary | ICD-10-CM | POA: Diagnosis not present

## 2021-08-05 DIAGNOSIS — R768 Other specified abnormal immunological findings in serum: Secondary | ICD-10-CM | POA: Diagnosis not present

## 2021-08-07 ENCOUNTER — Other Ambulatory Visit (HOSPITAL_COMMUNITY): Payer: Self-pay

## 2021-08-07 MED ORDER — LEVOTHYROXINE SODIUM 100 MCG PO TABS
100.0000 ug | ORAL_TABLET | ORAL | 3 refills | Status: DC
  Filled 2021-08-07: qty 30, 30d supply, fill #0
  Filled 2021-09-11: qty 30, 30d supply, fill #1

## 2021-08-08 ENCOUNTER — Other Ambulatory Visit (HOSPITAL_COMMUNITY): Payer: Self-pay

## 2021-08-12 DIAGNOSIS — Z6830 Body mass index (BMI) 30.0-30.9, adult: Secondary | ICD-10-CM | POA: Diagnosis not present

## 2021-08-12 DIAGNOSIS — E7800 Pure hypercholesterolemia, unspecified: Secondary | ICD-10-CM | POA: Diagnosis not present

## 2021-08-19 DIAGNOSIS — E063 Autoimmune thyroiditis: Secondary | ICD-10-CM | POA: Diagnosis not present

## 2021-08-19 DIAGNOSIS — Z6830 Body mass index (BMI) 30.0-30.9, adult: Secondary | ICD-10-CM | POA: Diagnosis not present

## 2021-08-20 ENCOUNTER — Other Ambulatory Visit: Payer: Self-pay | Admitting: Radiology

## 2021-08-20 DIAGNOSIS — M546 Pain in thoracic spine: Secondary | ICD-10-CM

## 2021-08-21 ENCOUNTER — Encounter: Payer: Self-pay | Admitting: Surgical

## 2021-08-21 NOTE — Progress Notes (Signed)
? ?Office Visit Note ?  ?Patient: Teresa Bell           ?Date of Birth: [birth year]           ?MRN: [id number] ?Visit Date: 08/02/2021 ?Requested by: Teresa Organ., MD ?[address] ?[place],  [place] [address] ?PCP: Teresa Organ., MD ? ?Subjective: ?Chief Complaint  ?Patient presents with  ? Middle Back - Pain  ? ? ?HPI: Teresa Bell is a 36 y.o. female who presents to the office complaining of axial thoracic spine pain.  She complains of about 3 months of pain without any incident or injury prior to onset of pain.  She describes severe stabbing pain in the mid thoracic spine around the level of her bra that is worse with leaning over for long periods of time.  She basically has pain constantly with any activity and pain has been worsening steadily over the last several weeks and months.  She has tried oral anti-inflammatories, Tylenol, stretching exercises without relief.  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  She has no history of spine surgery.  Denies any radicular pain down her legs or any radiation of pain around her chest wall.  She has no weakness in her legs or arms..   ?             ?ROS: All systems reviewed are negative as they relate to the chief complaint within the history of present illness.  Patient denies fevers or chills. ? ?Assessment & Plan: ?Visit Diagnoses:  ?1. Pain in thoracic spine   ? ? ?Plan: Patient is a 36 year old female who presents complaining of thoracic spine pain.  She has had pain for the last 3 months without any relief from anti-inflammatory medication or a PT directed home exercise program.  She feels her symptoms are progressively worsening and causing more more dysfunction in her daily life and interfering with her ability to do her job.  She has point tenderness over the axial thoracic spine but no evidence of radicular symptoms or  any weakness on exam.  However, with the progressive nature and failure of conservative treatment, highly recommended MRI of the thoracic spine for further evaluation of potential thoracic disc herniation.  Patient would like to hold off on MRI for now at the time of this visit on 08/02/2021; she will contact me if she would like to proceed with MRI if pain does not improve in the next several weeks.  Follow-up as needed. ? ?Follow-Up Instructions: No follow-ups on file.  ? ?Orders:  ?Orders Placed This Encounter  ?Procedures  ? XR Thoracic Spine 2 View  ? ?No orders of the defined types were placed in this encounter. ? ? ? ? Procedures: ?No procedures performed ? ? ?Clinical Data: ?No additional findings. ? ?Objective: ?Vital Signs: There were no vitals taken for this visit. ? ?Physical Exam:  ?Constitutional: Patient appears well-developed ?HEENT:  ?Head: Normocephalic ?Eyes:EOM are normal ?Neck: Normal range of motion ?Cardiovascular: Normal rate ?Pulmonary/chest: Effort normal ?Neurologic: Patient is alert ?Skin: Skin is warm ?Psychiatric: Patient has normal mood and affect ? ?Ortho Exam: Ortho exam demonstrates point tenderness in the axial thoracic spine around the level of patient's bra line.  She has no paraspinal musculature tenderness at this level or along the rest of the thoracic spine.  No  tenderness throughout the rest of the thoracic spine.  Pain is not significantly worse with extension but does cause her more discomfort with flexion of the spine.  She has excellent 5/5 motor strength of bilateral grip strength, finger abduction, pronation/supination, bicep, tricep, deltoid.  5/5 motor strength of bilateral hip flexion, quadricep, hamstring, dorsiflexion, plantarflexion. ? ?Specialty Comments:  ?No specialty comments available. ? ?Imaging: ?No results found. ? ? ?PMFS History: ?Patient Active Problem List  ? Diagnosis Date Noted  ? Hashimoto's thyroiditis 08/26/2019  ? PCOS (polycystic ovarian  syndrome) 08/26/2019  ? Vacuum-assisted vaginal delivery 04/28/2018  ? Second-degree perineal laceration, with delivery 04/28/2018  ? Acute blood loss anemia 04/28/2018  ? Obstetrical laceration: right sulcus 04/28/2018  ? Encounter for elective induction of labor 04/27/2018  ? Postpartum care following VAVD (11/26) 10/20/2014  ? Hypothyroidism affecting pregnancy in third trimester, antepartum   ? Cervical incompetence affecting management of pregnancy in second trimester, antepartum 07/24/2014  ? ?Past Medical History:  ?Diagnosis Date  ? Family history of adverse reaction to anesthesia   ? mother-- ponv  ? GERD (gastroesophageal reflux disease)   ? Hashimoto's disease   ? Hypothyroidism   ? Incompetent cervix   ? Polycystic ovary syndrome   ? Pregnancy   ?  ?Family History  ?Problem Relation Age of Onset  ? Heart disease Mother   ? Diabetes Mother   ? Thyroid disease Mother   ?     Hypothyroid  ? Hypertension Father   ? Cancer Father   ? Skin cancer Father   ? Cancer Maternal Grandmother   ?     bladder  ? Diabetes Maternal Grandfather   ? Heart disease Maternal Grandfather   ? Cancer Maternal Grandfather   ?     lymphoma  ? Hypertension Paternal Grandfather   ? Heart disease Paternal Grandfather   ? Lung cancer Maternal Uncle   ? Cancer Maternal Uncle   ? Bone cancer Other   ? Cancer Other   ?  ?Past Surgical History:  ?Procedure Laterality Date  ? CERVICAL CERCLAGE N/A 10/23/2017  ? Procedure: CERCLAGE CERVICAL;  Surgeon: Servando Salina, MD;  [facility];  Service: Gynecology;  Laterality: N/A;  EDD: 04/27/18  ? ORIF RIGHT ANKLE FRACTURE  2005  ? retained hardware  ? ?Social History  ? ?Occupational History  ? Not on file  ?Tobacco Use  ? Smoking status: Never  ? Smokeless tobacco: Never  ?Vaping Use  ? Vaping Use: Never used  ?Substance and Sexual Activity  ? Alcohol use: No  ? Drug use: No  ? Sexual activity: Yes  ?  Partners: Male  ?  Birth control/protection: Other-see comments   ?  Comment: 1ST intercourse- 23, partners- 87, married- 6 yrs, husband vasectomy  ? ? ? ? ?  ?

## 2021-08-26 DIAGNOSIS — E039 Hypothyroidism, unspecified: Secondary | ICD-10-CM | POA: Diagnosis not present

## 2021-08-26 DIAGNOSIS — Z6830 Body mass index (BMI) 30.0-30.9, adult: Secondary | ICD-10-CM | POA: Diagnosis not present

## 2021-09-02 DIAGNOSIS — Z6829 Body mass index (BMI) 29.0-29.9, adult: Secondary | ICD-10-CM | POA: Diagnosis not present

## 2021-09-02 DIAGNOSIS — R768 Other specified abnormal immunological findings in serum: Secondary | ICD-10-CM | POA: Diagnosis not present

## 2021-09-04 ENCOUNTER — Other Ambulatory Visit (HOSPITAL_COMMUNITY): Payer: Self-pay

## 2021-09-04 ENCOUNTER — Other Ambulatory Visit: Payer: Self-pay | Admitting: Surgical

## 2021-09-04 MED ORDER — DIAZEPAM 2 MG PO TABS
2.0000 mg | ORAL_TABLET | Freq: Once | ORAL | 0 refills | Status: AC
  Filled 2021-09-04: qty 2, 2d supply, fill #0

## 2021-09-07 ENCOUNTER — Ambulatory Visit
Admission: RE | Admit: 2021-09-07 | Discharge: 2021-09-07 | Disposition: A | Discharge status: Home / Self Care | Payer: 59 | Source: Ambulatory Visit | Attending: Surgical | Admitting: Surgical

## 2021-09-07 DIAGNOSIS — M5124 Other intervertebral disc displacement, thoracic region: Secondary | ICD-10-CM | POA: Diagnosis not present

## 2021-09-07 DIAGNOSIS — M546 Pain in thoracic spine: Secondary | ICD-10-CM

## 2021-09-09 DIAGNOSIS — E063 Autoimmune thyroiditis: Secondary | ICD-10-CM | POA: Diagnosis not present

## 2021-09-09 DIAGNOSIS — Z6830 Body mass index (BMI) 30.0-30.9, adult: Secondary | ICD-10-CM | POA: Diagnosis not present

## 2021-09-11 ENCOUNTER — Other Ambulatory Visit (HOSPITAL_COMMUNITY): Payer: Self-pay

## 2021-09-13 ENCOUNTER — Other Ambulatory Visit: Payer: Self-pay | Admitting: Surgical

## 2021-09-13 ENCOUNTER — Other Ambulatory Visit (HOSPITAL_COMMUNITY): Payer: Self-pay

## 2021-09-13 MED ORDER — METHOCARBAMOL 500 MG PO TABS
500.0000 mg | ORAL_TABLET | Freq: Three times a day (TID) | ORAL | 0 refills | Status: DC | PRN
Start: 2021-09-13 — End: 2022-02-20
  Filled 2021-09-13: qty 30, 10d supply, fill #0

## 2021-09-13 MED ORDER — PREDNISONE 10 MG (21) PO TBPK
ORAL_TABLET | ORAL | 0 refills | Status: DC
Start: 2021-09-13 — End: 2022-01-16
  Filled 2021-09-13: qty 21, 6d supply, fill #0

## 2021-09-16 DIAGNOSIS — Z6829 Body mass index (BMI) 29.0-29.9, adult: Secondary | ICD-10-CM | POA: Diagnosis not present

## 2021-09-16 DIAGNOSIS — R768 Other specified abnormal immunological findings in serum: Secondary | ICD-10-CM | POA: Diagnosis not present

## 2021-09-23 DIAGNOSIS — E039 Hypothyroidism, unspecified: Secondary | ICD-10-CM | POA: Diagnosis not present

## 2021-09-23 DIAGNOSIS — Z6829 Body mass index (BMI) 29.0-29.9, adult: Secondary | ICD-10-CM | POA: Diagnosis not present

## 2021-09-30 DIAGNOSIS — N951 Menopausal and female climacteric states: Secondary | ICD-10-CM | POA: Diagnosis not present

## 2021-09-30 DIAGNOSIS — E7800 Pure hypercholesterolemia, unspecified: Secondary | ICD-10-CM | POA: Diagnosis not present

## 2021-09-30 DIAGNOSIS — Z6829 Body mass index (BMI) 29.0-29.9, adult: Secondary | ICD-10-CM | POA: Diagnosis not present

## 2021-09-30 DIAGNOSIS — E063 Autoimmune thyroiditis: Secondary | ICD-10-CM | POA: Diagnosis not present

## 2021-10-07 DIAGNOSIS — Z6829 Body mass index (BMI) 29.0-29.9, adult: Secondary | ICD-10-CM | POA: Diagnosis not present

## 2021-10-07 DIAGNOSIS — R768 Other specified abnormal immunological findings in serum: Secondary | ICD-10-CM | POA: Diagnosis not present

## 2021-10-14 ENCOUNTER — Other Ambulatory Visit (HOSPITAL_COMMUNITY): Payer: Self-pay

## 2021-10-14 DIAGNOSIS — Z6829 Body mass index (BMI) 29.0-29.9, adult: Secondary | ICD-10-CM | POA: Diagnosis not present

## 2021-10-14 DIAGNOSIS — E039 Hypothyroidism, unspecified: Secondary | ICD-10-CM | POA: Diagnosis not present

## 2021-10-14 MED ORDER — NP THYROID 90 MG PO TABS
90.0000 mg | ORAL_TABLET | Freq: Every day | ORAL | 0 refills | Status: DC
  Filled 2021-10-14: qty 30, 30d supply, fill #0

## 2021-11-04 ENCOUNTER — Other Ambulatory Visit (HOSPITAL_COMMUNITY): Payer: Self-pay

## 2021-11-04 DIAGNOSIS — E039 Hypothyroidism, unspecified: Secondary | ICD-10-CM | POA: Diagnosis not present

## 2021-11-04 DIAGNOSIS — Z6828 Body mass index (BMI) 28.0-28.9, adult: Secondary | ICD-10-CM | POA: Diagnosis not present

## 2021-11-04 DIAGNOSIS — E063 Autoimmune thyroiditis: Secondary | ICD-10-CM | POA: Diagnosis not present

## 2021-11-04 MED ORDER — LEVOTHYROXINE SODIUM 100 MCG PO TABS
100.0000 ug | ORAL_TABLET | Freq: Every morning | ORAL | 1 refills | Status: DC
  Filled 2021-11-04 (×2): qty 30, 30d supply, fill #0

## 2021-11-18 DIAGNOSIS — E282 Polycystic ovarian syndrome: Secondary | ICD-10-CM | POA: Diagnosis not present

## 2021-11-18 DIAGNOSIS — Z6828 Body mass index (BMI) 28.0-28.9, adult: Secondary | ICD-10-CM | POA: Diagnosis not present

## 2021-11-28 DIAGNOSIS — D2239 Melanocytic nevi of other parts of face: Secondary | ICD-10-CM | POA: Diagnosis not present

## 2021-11-28 DIAGNOSIS — D485 Neoplasm of uncertain behavior of skin: Secondary | ICD-10-CM | POA: Diagnosis not present

## 2021-12-06 ENCOUNTER — Other Ambulatory Visit (HOSPITAL_COMMUNITY): Payer: Self-pay

## 2021-12-06 DIAGNOSIS — Z6828 Body mass index (BMI) 28.0-28.9, adult: Secondary | ICD-10-CM | POA: Diagnosis not present

## 2021-12-06 DIAGNOSIS — E039 Hypothyroidism, unspecified: Secondary | ICD-10-CM | POA: Diagnosis not present

## 2021-12-06 MED ORDER — NP THYROID 90 MG PO TABS
90.0000 mg | ORAL_TABLET | Freq: Every day | ORAL | 0 refills | Status: DC
  Filled 2021-12-06: qty 30, 30d supply, fill #0

## 2021-12-31 DIAGNOSIS — Z6828 Body mass index (BMI) 28.0-28.9, adult: Secondary | ICD-10-CM | POA: Diagnosis not present

## 2021-12-31 DIAGNOSIS — E063 Autoimmune thyroiditis: Secondary | ICD-10-CM | POA: Diagnosis not present

## 2022-01-16 ENCOUNTER — Ambulatory Visit (INDEPENDENT_AMBULATORY_CARE_PROVIDER_SITE_OTHER): Payer: 59 | Admitting: Obstetrics & Gynecology

## 2022-01-16 ENCOUNTER — Other Ambulatory Visit (HOSPITAL_COMMUNITY): Payer: Self-pay

## 2022-01-16 ENCOUNTER — Encounter: Payer: Self-pay | Admitting: Obstetrics & Gynecology

## 2022-01-16 VITALS — BP 120/72 | HR 60 | Resp 12 | Ht 64.5 in | Wt 181.0 lb

## 2022-01-16 DIAGNOSIS — Z01419 Encounter for gynecological examination (general) (routine) without abnormal findings: Secondary | ICD-10-CM

## 2022-01-16 DIAGNOSIS — N920 Excessive and frequent menstruation with regular cycle: Secondary | ICD-10-CM | POA: Diagnosis not present

## 2022-01-16 DIAGNOSIS — Z9189 Other specified personal risk factors, not elsewhere classified: Secondary | ICD-10-CM

## 2022-01-16 MED ORDER — THYROID 90 MG PO TABS
90.0000 mg | ORAL_TABLET | Freq: Every day | ORAL | 0 refills | Status: DC
  Filled 2022-01-16: qty 30, 30d supply, fill #0

## 2022-01-16 NOTE — Progress Notes (Signed)
Teresa Bell 02-May-1986 [id number]   History:    36 y.o. G2P2L2 Married.  Vasectomy.  Daughter is 3 yo doing very well.  Son is 51 1/2 yo.   RP:  Established patient presenting for annual gyn exam   HPI:  Menses regular with heavy flow every 32-33 days.  PMDD.  No BTB.  No pelvic pain.  Pap Neg 11/2020.  No h/o abnormal Pap.  Will repeat Pap at 2-3 yrs.  Breasts wnl.  Left Dx mammo/Breast US Negative in 04/2020.  Will start annual mammo at age 54. Good fitness.  BMI 30.59.  Hashimoto Thyroiditis difficult to control.  On NP Thyroid currently. Had increased liver enzymes on BCPs previously.  Health labs with Fam MD.   Past medical history,surgical history, family history and social history were all reviewed and documented in the EPIC chart.  Gynecologic History Patient's last menstrual period was 01/05/2022.  Obstetric History OB History  Gravida Para Term Preterm AB Living  '2 2 2     2  '$ SAB IAB Ectopic Multiple Live Births        0 2    # Outcome Date GA Lbr Len/2nd Weight Sex Delivery Anes PTL Lv  2 Term 04/27/18 57w0d07:13 / 04:31 9 lb 2.6 oz (4.155 kg) M Vag-Vacuum EPI  LIV  1 Term 10/19/14 369w0d4:25 / 00:18 7 lb 15.5 oz (3.615 kg) F Vag-Spont EPI  LIV     Birth Comments: None     ROS: A ROS was performed and pertinent positives and negatives are included in the history. GENERAL: No fevers or chills. HEENT: No change in vision, no earache, sore throat or sinus congestion. NECK: No pain or stiffness. CARDIOVASCULAR: No chest pain or pressure. No palpitations. PULMONARY: No shortness of breath, cough or wheeze. GASTROINTESTINAL: No abdominal pain, nausea, vomiting or diarrhea, melena or bright red blood per rectum. GENITOURINARY: No urinary frequency, urgency, hesitancy or dysuria. MUSCULOSKELETAL: No joint or muscle pain, no back pain, no recent trauma. DERMATOLOGIC: No rash, no itching, no lesions. ENDOCRINE: No polyuria, polydipsia, no heat or cold intolerance. No recent change  in weight. HEMATOLOGICAL: No anemia or easy bruising or bleeding. NEUROLOGIC: No headache, seizures, numbness, tingling or weakness. PSYCHIATRIC: No depression, no loss of interest in normal activity or change in sleep pattern.     Exam:   BP 120/72   Pulse 60   Resp 12   Ht 5' 4.5" (1.638 m)   Wt 181 lb (82.1 kg)   LMP 01/05/2022   BMI 30.59 kg/m   Body mass index is 30.59 kg/m.  General appearance : Well developed well nourished female. No acute distress HEENT: Eyes: no retinal hemorrhage or exudates,  Neck supple, trachea midline, no carotid bruits, no thyroidmegaly Lungs: Clear to auscultation, no rhonchi or wheezes, or rib retractions  Heart: Regular rate and rhythm, no murmurs or gallops Breast:Examined in sitting and supine position were symmetrical in appearance, no palpable masses or tenderness,  no skin retraction, no nipple inversion, no nipple discharge, no skin discoloration, no axillary or supraclavicular lymphadenopathy Abdomen: no palpable masses or tenderness, no rebound or guarding Extremities: no edema or skin discoloration or tenderness  Pelvic: Vulva: Normal             Vagina: No gross lesions or discharge  Cervix: No gross lesions or discharge  Uterus  AV, normal size, shape and consistency, non-tender and mobile  Adnexa  Without masses or tenderness  Anus: Normal  Assessment/Plan:  36 y.o. female for annual exam   1. Well female exam with routine gynecological exam Menses regular with heavy flow every 32-33 days.  PMDD.  No BTB.  No pelvic pain.  Pap Neg 11/2020.  No h/o abnormal Pap.  Will repeat Pap at 2-3 yrs.  Breasts wnl.  Left Dx mammo/Breast US Negative in 04/2020.  Will start annual mammo at age 71. Good fitness.  BMI 30.59.  Hashimoto Thyroiditis difficult to control.  On NP Thyroid currently. Had increased liver enzymes on BCPs previously.  Health labs with Fam MD coming up, will do CBC.  2. Relies on partner vasectomy for contraception  3.  Menorrhagia with regular cycle Menses regular with heavy flow every 32-33 days.  PMDD.  No BTB.  No pelvic pain.  Hashimoto Thyroiditis difficult to control.  On NP Thyroid currently. Had increased liver enzymes on BCPs previously.  Will do CBC at [facility] MD's office.  F/U Pelvic US to further investigate.  Will manage per results. - US Transvaginal Non-OB; Future   Princess Bruins MD, 3:11 PM 01/16/2022

## 2022-01-21 DIAGNOSIS — Z130 Encounter for screening for diseases of the blood and blood-forming organs and certain disorders involving the immune mechanism: Secondary | ICD-10-CM | POA: Diagnosis not present

## 2022-01-21 DIAGNOSIS — E063 Autoimmune thyroiditis: Secondary | ICD-10-CM | POA: Diagnosis not present

## 2022-01-21 DIAGNOSIS — Z6828 Body mass index (BMI) 28.0-28.9, adult: Secondary | ICD-10-CM | POA: Diagnosis not present

## 2022-01-21 DIAGNOSIS — R768 Other specified abnormal immunological findings in serum: Secondary | ICD-10-CM | POA: Diagnosis not present

## 2022-01-28 DIAGNOSIS — R5382 Chronic fatigue, unspecified: Secondary | ICD-10-CM | POA: Diagnosis not present

## 2022-01-28 DIAGNOSIS — E039 Hypothyroidism, unspecified: Secondary | ICD-10-CM | POA: Diagnosis not present

## 2022-01-28 DIAGNOSIS — N920 Excessive and frequent menstruation with regular cycle: Secondary | ICD-10-CM | POA: Diagnosis not present

## 2022-01-28 DIAGNOSIS — Z6828 Body mass index (BMI) 28.0-28.9, adult: Secondary | ICD-10-CM | POA: Diagnosis not present

## 2022-01-28 DIAGNOSIS — G4700 Insomnia, unspecified: Secondary | ICD-10-CM | POA: Diagnosis not present

## 2022-01-29 ENCOUNTER — Other Ambulatory Visit (HOSPITAL_COMMUNITY): Payer: Self-pay

## 2022-01-29 MED ORDER — THYROID 120 MG PO TABS
120.0000 mg | ORAL_TABLET | Freq: Every day | ORAL | 1 refills | Status: DC
  Filled 2022-01-29: qty 30, 30d supply, fill #0
  Filled 2022-03-05: qty 30, 30d supply, fill #1

## 2022-02-20 ENCOUNTER — Encounter: Payer: Self-pay | Admitting: Obstetrics & Gynecology

## 2022-02-20 ENCOUNTER — Ambulatory Visit (INDEPENDENT_AMBULATORY_CARE_PROVIDER_SITE_OTHER): Payer: 59

## 2022-02-20 ENCOUNTER — Ambulatory Visit: Payer: 59 | Admitting: Obstetrics & Gynecology

## 2022-02-20 VITALS — BP 104/68 | HR 57

## 2022-02-20 DIAGNOSIS — R9389 Abnormal findings on diagnostic imaging of other specified body structures: Secondary | ICD-10-CM | POA: Diagnosis not present

## 2022-02-20 DIAGNOSIS — N920 Excessive and frequent menstruation with regular cycle: Secondary | ICD-10-CM

## 2022-02-20 DIAGNOSIS — Z9189 Other specified personal risk factors, not elsewhere classified: Secondary | ICD-10-CM | POA: Diagnosis not present

## 2022-02-20 NOTE — Progress Notes (Signed)
    Teresa Bell 10-18-1985 [id number]        36 y.o.  G2P2002 Married.  Vasectomy.  RP: Menorrhagia for Pelvic US  HPI: Menses regular with heavy flow every 32-33 days.  PMDD.  No BTB.  No pelvic pain.  Hashimoto Thyroiditis difficult to control.  On NP Thyroid currently. Had increased liver enzymes on BCPs previously.    OB History  Gravida Para Term Preterm AB Living  '2 2 2     2  '$ SAB IAB Ectopic Multiple Live Births        0 2    # Outcome Date GA Lbr Len/2nd Weight Sex Delivery Anes PTL Lv  2 Term 04/27/18 34w0d07:13 / 04:31 9 lb 2.6 oz (4.155 kg) M Vag-Vacuum EPI  LIV  1 Term 10/19/14 311w0d4:25 / 00:18 7 lb 15.5 oz (3.615 kg) F Vag-Spont EPI  LIV     Birth Comments: None    Past medical history,surgical history, problem list, medications, allergies, family history and social history were all reviewed and documented in the EPIC chart.   Directed ROS with pertinent positives and negatives documented in the history of present illness/assessment and plan.  Exam:  Vitals:   02/20/22 1607  BP: 104/68  Pulse: (!) 57  SpO2: 99%   General appearance:  Normal  Pelvic US[place]oreaoday: T/V images.  Anteverted uterus normal in size and shape with no myometrial mass.  Heterogeneous myometrium with streaky shadowing suggestive of mild adenomyosis.  The uterus is measured at 7.78 x 5.78 x 5.04 cm.  Thickened avascular endometrium measured at 15.02 mm.  No obvious mass seen.  Both ovaries are mobile, enlarged with multi follicular pattern time.  Patient endorses a history of PCOS.  Small avascular resolving left corpus luteum cyst.  Positive perfusion to both ovaries.  No adnexal mass.  Trace free fluid in the pelvis.   Assessment/Plan:  [age].o. G2P2002   1. Menorrhagia with regular cycle Menses regular with heavy flow every 32-33 days.  PMDD.  No BTB.  No pelvic pain.  Hashimoto Thyroiditis difficult to control.  On NP Thyroid currently. Had increased liver enzymes on BCPs previously.   May decide to try the Progestin only pill per Sonohysto findings. - US[place]oreaonohysterogram; Future  2. Thickened endometrium Thickened endometrium on pelvic ultrasound today measured at 15.02 mm.  Decision to proceed with sonohysterogram, possible endometrial biopsy per results. - US[place]oreaonohysterogram; Future  3. Relies on partner vasectomy for contraception   MaPrincess BruinsD, 4:23 PM 02/20/2022

## 2022-02-22 ENCOUNTER — Encounter: Payer: Self-pay | Admitting: Obstetrics & Gynecology

## 2022-02-24 DIAGNOSIS — R7989 Other specified abnormal findings of blood chemistry: Secondary | ICD-10-CM | POA: Diagnosis not present

## 2022-02-24 DIAGNOSIS — E039 Hypothyroidism, unspecified: Secondary | ICD-10-CM | POA: Diagnosis not present

## 2022-02-24 DIAGNOSIS — Z0000 Encounter for general adult medical examination without abnormal findings: Secondary | ICD-10-CM | POA: Diagnosis not present

## 2022-02-25 DIAGNOSIS — N951 Menopausal and female climacteric states: Secondary | ICD-10-CM | POA: Diagnosis not present

## 2022-02-25 DIAGNOSIS — E063 Autoimmune thyroiditis: Secondary | ICD-10-CM | POA: Diagnosis not present

## 2022-03-03 ENCOUNTER — Other Ambulatory Visit (HOSPITAL_COMMUNITY): Payer: Self-pay

## 2022-03-03 DIAGNOSIS — Z1339 Encounter for screening examination for other mental health and behavioral disorders: Secondary | ICD-10-CM | POA: Diagnosis not present

## 2022-03-03 DIAGNOSIS — E8881 Metabolic syndrome: Secondary | ICD-10-CM | POA: Diagnosis not present

## 2022-03-03 DIAGNOSIS — R7301 Impaired fasting glucose: Secondary | ICD-10-CM | POA: Diagnosis not present

## 2022-03-03 DIAGNOSIS — E282 Polycystic ovarian syndrome: Secondary | ICD-10-CM | POA: Diagnosis not present

## 2022-03-03 DIAGNOSIS — E669 Obesity, unspecified: Secondary | ICD-10-CM | POA: Diagnosis not present

## 2022-03-03 DIAGNOSIS — E039 Hypothyroidism, unspecified: Secondary | ICD-10-CM | POA: Diagnosis not present

## 2022-03-03 DIAGNOSIS — Z1331 Encounter for screening for depression: Secondary | ICD-10-CM | POA: Diagnosis not present

## 2022-03-03 DIAGNOSIS — R768 Other specified abnormal immunological findings in serum: Secondary | ICD-10-CM | POA: Diagnosis not present

## 2022-03-03 DIAGNOSIS — G43909 Migraine, unspecified, not intractable, without status migrainosus: Secondary | ICD-10-CM | POA: Diagnosis not present

## 2022-03-03 DIAGNOSIS — Z0000 Encounter for general adult medical examination without abnormal findings: Secondary | ICD-10-CM | POA: Diagnosis not present

## 2022-03-03 MED ORDER — WEGOVY 0.25 MG/0.5ML ~~LOC~~ SOAJ
0.2500 mg | SUBCUTANEOUS | 0 refills | Status: DC
  Filled 2022-03-03 (×2): qty 2, 28d supply, fill #0

## 2022-03-03 MED ORDER — TOPIRAMATE 50 MG PO TABS
50.0000 mg | ORAL_TABLET | Freq: Every day | ORAL | 11 refills | Status: DC
  Filled 2022-03-03: qty 30, 30d supply, fill #0

## 2022-03-03 MED ORDER — WEGOVY 1 MG/0.5ML ~~LOC~~ SOAJ
1.0000 mg | SUBCUTANEOUS | 0 refills | Status: DC
  Filled 2022-03-03: qty 2, 28d supply, fill #0

## 2022-03-03 MED ORDER — SUMATRIPTAN SUCCINATE 100 MG PO TABS
100.0000 mg | ORAL_TABLET | ORAL | 11 refills | Status: DC | PRN
  Filled 2022-03-03: qty 9, 30d supply, fill #0

## 2022-03-03 MED ORDER — WEGOVY 0.5 MG/0.5ML ~~LOC~~ SOAJ
0.5000 mg | SUBCUTANEOUS | 0 refills | Status: DC
  Filled 2022-03-03 (×2): qty 2, 28d supply, fill #0

## 2022-03-04 DIAGNOSIS — R5382 Chronic fatigue, unspecified: Secondary | ICD-10-CM | POA: Diagnosis not present

## 2022-03-04 DIAGNOSIS — E039 Hypothyroidism, unspecified: Secondary | ICD-10-CM | POA: Diagnosis not present

## 2022-03-04 DIAGNOSIS — Z6828 Body mass index (BMI) 28.0-28.9, adult: Secondary | ICD-10-CM | POA: Diagnosis not present

## 2022-03-04 DIAGNOSIS — N951 Menopausal and female climacteric states: Secondary | ICD-10-CM | POA: Diagnosis not present

## 2022-03-05 ENCOUNTER — Other Ambulatory Visit (HOSPITAL_COMMUNITY): Payer: Self-pay

## 2022-03-12 ENCOUNTER — Encounter: Payer: Self-pay | Admitting: Obstetrics & Gynecology

## 2022-03-12 ENCOUNTER — Ambulatory Visit (INDEPENDENT_AMBULATORY_CARE_PROVIDER_SITE_OTHER): Payer: 59

## 2022-03-12 ENCOUNTER — Other Ambulatory Visit (HOSPITAL_COMMUNITY): Payer: Self-pay

## 2022-03-12 ENCOUNTER — Other Ambulatory Visit: Payer: 59

## 2022-03-12 ENCOUNTER — Ambulatory Visit: Payer: 59 | Admitting: Obstetrics & Gynecology

## 2022-03-12 ENCOUNTER — Other Ambulatory Visit: Payer: 59 | Admitting: Obstetrics & Gynecology

## 2022-03-12 VITALS — BP 114/72 | HR 74

## 2022-03-12 DIAGNOSIS — R9389 Abnormal findings on diagnostic imaging of other specified body structures: Secondary | ICD-10-CM

## 2022-03-12 DIAGNOSIS — N920 Excessive and frequent menstruation with regular cycle: Secondary | ICD-10-CM

## 2022-03-12 MED ORDER — NORETHINDRONE 0.35 MG PO TABS
1.0000 | ORAL_TABLET | Freq: Every day | ORAL | 4 refills | Status: DC
  Filled 2022-03-12: qty 84, 84d supply, fill #0

## 2022-03-12 NOTE — Progress Notes (Signed)
Teresa Bell November 26, 1985 [id number]        36 y.o.  G2P2002   RP: Thickened endometrium for Sonohysto  HPI: Menses regular with heavy flow every 32-33 days.  PMDD.  No BTB.  No pelvic pain.  Hashimoto Thyroiditis difficult to control.  On NP Thyroid currently. Had increased liver enzymes on BCPs previously.   On the Pelvic US 02/20/22 we noted: Anteverted uterus normal in size and shape with no myometrial mass. Heterogeneous myometrium with streaky shadowing suggestive of mild adenomyosis.  The uterus is measured at 7.78 x 5.78 x 5.04 cm.  Thickened avascular endometrium measured at 15.02 mm.  No obvious mass seen.   OB History  Gravida Para Term Preterm AB Living  '2 2 2     2  '$ SAB IAB Ectopic Multiple Live Births        0 2    # Outcome Date GA Lbr Len/2nd Weight Sex Delivery Anes PTL Lv  2 Term 04/27/18 15w0d07:13 / 04:31 9 lb 2.6 oz (4.155 kg) M Vag-Vacuum EPI  LIV  1 Term 10/19/14 366w0d4:25 / 00:18 7 lb 15.5 oz (3.615 kg) F Vag-Spont EPI  LIV     Birth Comments: None    Past medical history,surgical history, problem list, medications, allergies, family history and social history were all reviewed and documented in the EPIC chart.   Directed ROS with pertinent positives and negatives documented in the history of present illness/assessment and plan.  Exam:  There were no vitals filed for this visit. General appearance:  Normal                                                                    Sono Infusion Hysterogram ( procedure note)   The initial transvaginal ultrasound demonstrated the following:  T/V images.  Anteverted uterus with an asymmetrical tri-layered endometrium measured at 6.27 mm.  Questionable area at the fundal portion of the canal that may represent an endometrial polyp.  Feeder vessel seen.  Bilateral ovaries are enlarged with multi follicular pattern.  Positive perfusion to both ovaries.  Previously seen corpus luteum cyst on the left ovary has  completely resolved.  Simple avascular dominant follicle seen on the left ovary.  The speculum  was inserted and the cervix cleansed with Betadine solution after confirming that patient has no allergies.A small sonohysterography catheterwas utilized.  Insertion was facilitated with ring forceps, using a spear-like motion the catheter was inserted to the fundus of the uterus. The speculum is then removed carefully to avoid dislodging the catheter. The catheter was flushed with sterile saline delete prior to insertion to rid it of small amounts of air.the sterile saline solution was infused into the uterine cavity as a vaginal ultrasound probe was then placed in the vagina for full visualization of the uterine cavity from a transvaginal approach. The following was noted:  Symmetrical endometrium measuring 5.3 mm.  No lesion seen at the endometrium.  Small endocervical focus measured at 0.9 x 0.4 cm.  The catheter was then removed after retrieving some of the saline from the intrauterine cavity. An endometrial biopsy was not done. Patient tolerated procedure well.   Assessment/Plan:  [age].o. G2P2002   1. Thickened endometrium Menses regular with  heavy flow every 32-33 days.  PMDD.  No BTB.  No pelvic pain.  Hashimoto Thyroiditis difficult to control.  On NP Thyroid currently. Had increased liver enzymes on BCPs previously.  Sono hysterogram successful a normal symmetrical endometrium at 5.3 mm, no lesion present.  Very small possible endocervical polyp, decision to observe.  2. Menorrhagia with regular cycle Heavy manses with previous increase in liver enzymes on estrogen progestin birth control pills.  Decision to start on the progestin only birth control pill to control the cycle.  No contraindication.  Usage reviewed and prescription sent to pharmacy.  Other orders - norethindrone (MICRONOR) 0.35 MG tablet; Take 1 tablet (0.35 mg total) by mouth daily.   Princess Bruins MD, 2:56 PM  03/12/2022

## 2022-03-16 ENCOUNTER — Encounter: Payer: Self-pay | Admitting: Obstetrics & Gynecology

## 2022-03-20 ENCOUNTER — Other Ambulatory Visit (HOSPITAL_COMMUNITY): Payer: Self-pay

## 2022-04-16 DIAGNOSIS — E063 Autoimmune thyroiditis: Secondary | ICD-10-CM | POA: Diagnosis not present

## 2022-04-16 DIAGNOSIS — Z6829 Body mass index (BMI) 29.0-29.9, adult: Secondary | ICD-10-CM | POA: Diagnosis not present

## 2022-04-17 ENCOUNTER — Other Ambulatory Visit (HOSPITAL_COMMUNITY): Payer: Self-pay

## 2022-04-17 MED ORDER — THYROID 120 MG PO TABS
120.0000 mg | ORAL_TABLET | Freq: Every day | ORAL | 1 refills | Status: DC
  Filled 2022-04-17: qty 30, 30d supply, fill #0
  Filled 2022-05-21: qty 30, 30d supply, fill #1

## 2022-04-30 DIAGNOSIS — Z6829 Body mass index (BMI) 29.0-29.9, adult: Secondary | ICD-10-CM | POA: Diagnosis not present

## 2022-04-30 DIAGNOSIS — E039 Hypothyroidism, unspecified: Secondary | ICD-10-CM | POA: Diagnosis not present

## 2022-06-24 ENCOUNTER — Other Ambulatory Visit (HOSPITAL_COMMUNITY): Payer: Self-pay

## 2022-06-24 DIAGNOSIS — E063 Autoimmune thyroiditis: Secondary | ICD-10-CM | POA: Diagnosis not present

## 2022-06-24 DIAGNOSIS — Z6830 Body mass index (BMI) 30.0-30.9, adult: Secondary | ICD-10-CM | POA: Diagnosis not present

## 2022-06-24 MED ORDER — THYROID 120 MG PO TABS
120.0000 mg | ORAL_TABLET | Freq: Every day | ORAL | 1 refills | Status: DC
  Filled 2022-06-24: qty 30, 30d supply, fill #0

## 2022-06-26 DIAGNOSIS — N951 Menopausal and female climacteric states: Secondary | ICD-10-CM | POA: Diagnosis not present

## 2022-06-26 DIAGNOSIS — E7800 Pure hypercholesterolemia, unspecified: Secondary | ICD-10-CM | POA: Diagnosis not present

## 2022-06-26 DIAGNOSIS — R5382 Chronic fatigue, unspecified: Secondary | ICD-10-CM | POA: Diagnosis not present

## 2022-06-26 DIAGNOSIS — E559 Vitamin D deficiency, unspecified: Secondary | ICD-10-CM | POA: Diagnosis not present

## 2022-06-26 DIAGNOSIS — E039 Hypothyroidism, unspecified: Secondary | ICD-10-CM | POA: Diagnosis not present

## 2022-07-01 ENCOUNTER — Other Ambulatory Visit (HOSPITAL_COMMUNITY): Payer: Self-pay

## 2022-07-01 DIAGNOSIS — R768 Other specified abnormal immunological findings in serum: Secondary | ICD-10-CM | POA: Diagnosis not present

## 2022-07-01 DIAGNOSIS — R232 Flushing: Secondary | ICD-10-CM | POA: Diagnosis not present

## 2022-07-01 DIAGNOSIS — E063 Autoimmune thyroiditis: Secondary | ICD-10-CM | POA: Diagnosis not present

## 2022-07-01 DIAGNOSIS — N951 Menopausal and female climacteric states: Secondary | ICD-10-CM | POA: Diagnosis not present

## 2022-07-01 DIAGNOSIS — M2550 Pain in unspecified joint: Secondary | ICD-10-CM | POA: Diagnosis not present

## 2022-07-01 DIAGNOSIS — Z6830 Body mass index (BMI) 30.0-30.9, adult: Secondary | ICD-10-CM | POA: Diagnosis not present

## 2022-07-01 MED ORDER — LIOTHYRONINE SODIUM 5 MCG PO TABS
10.0000 ug | ORAL_TABLET | Freq: Every day | ORAL | 1 refills | Status: DC
  Filled 2022-07-01: qty 60, 30d supply, fill #0
  Filled 2022-07-24: qty 60, 30d supply, fill #1

## 2022-07-01 MED ORDER — LEVOTHYROXINE SODIUM 100 MCG PO TABS
100.0000 ug | ORAL_TABLET | Freq: Every morning | ORAL | 1 refills | Status: DC
  Filled 2022-07-01: qty 30, 30d supply, fill #0
  Filled 2022-07-24: qty 30, 30d supply, fill #1

## 2022-07-18 DIAGNOSIS — Z6830 Body mass index (BMI) 30.0-30.9, adult: Secondary | ICD-10-CM | POA: Diagnosis not present

## 2022-07-18 DIAGNOSIS — E063 Autoimmune thyroiditis: Secondary | ICD-10-CM | POA: Diagnosis not present

## 2022-07-25 ENCOUNTER — Other Ambulatory Visit (HOSPITAL_COMMUNITY): Payer: Self-pay

## 2022-08-04 DIAGNOSIS — E282 Polycystic ovarian syndrome: Secondary | ICD-10-CM | POA: Diagnosis not present

## 2022-08-04 DIAGNOSIS — Z6830 Body mass index (BMI) 30.0-30.9, adult: Secondary | ICD-10-CM | POA: Diagnosis not present

## 2022-08-07 DIAGNOSIS — E039 Hypothyroidism, unspecified: Secondary | ICD-10-CM | POA: Diagnosis not present

## 2022-08-07 DIAGNOSIS — N951 Menopausal and female climacteric states: Secondary | ICD-10-CM | POA: Diagnosis not present

## 2022-08-15 ENCOUNTER — Other Ambulatory Visit (HOSPITAL_COMMUNITY): Payer: Self-pay

## 2022-08-15 DIAGNOSIS — N951 Menopausal and female climacteric states: Secondary | ICD-10-CM | POA: Diagnosis not present

## 2022-08-15 DIAGNOSIS — Z6829 Body mass index (BMI) 29.0-29.9, adult: Secondary | ICD-10-CM | POA: Diagnosis not present

## 2022-08-15 DIAGNOSIS — R232 Flushing: Secondary | ICD-10-CM | POA: Diagnosis not present

## 2022-08-15 DIAGNOSIS — R5382 Chronic fatigue, unspecified: Secondary | ICD-10-CM | POA: Diagnosis not present

## 2022-08-15 MED ORDER — PROGESTERONE MICRONIZED 100 MG PO CAPS
100.0000 mg | ORAL_CAPSULE | Freq: Every day | ORAL | 1 refills | Status: DC
  Filled 2022-08-15: qty 30, 30d supply, fill #0

## 2022-09-02 ENCOUNTER — Other Ambulatory Visit (HOSPITAL_COMMUNITY): Payer: Self-pay

## 2022-09-02 MED ORDER — LEVOTHYROXINE SODIUM 100 MCG PO TABS
100.0000 ug | ORAL_TABLET | Freq: Every morning | ORAL | 1 refills | Status: DC
  Filled 2022-09-02: qty 30, 30d supply, fill #0
  Filled 2022-10-06: qty 30, 30d supply, fill #1

## 2022-09-03 ENCOUNTER — Other Ambulatory Visit (HOSPITAL_COMMUNITY): Payer: Self-pay

## 2022-09-04 ENCOUNTER — Ambulatory Visit: Payer: Commercial Managed Care - PPO | Admitting: Obstetrics & Gynecology

## 2022-09-04 ENCOUNTER — Encounter: Payer: Self-pay | Admitting: Obstetrics & Gynecology

## 2022-09-04 ENCOUNTER — Telehealth: Payer: Self-pay

## 2022-09-04 ENCOUNTER — Other Ambulatory Visit (HOSPITAL_COMMUNITY): Payer: Self-pay

## 2022-09-04 VITALS — BP 110/74 | HR 64

## 2022-09-04 DIAGNOSIS — N750 Cyst of Bartholin's gland: Secondary | ICD-10-CM

## 2022-09-04 MED ORDER — CEPHALEXIN 500 MG PO CAPS
500.0000 mg | ORAL_CAPSULE | Freq: Two times a day (BID) | ORAL | 0 refills | Status: AC
  Filled 2022-09-04: qty 14, 7d supply, fill #0

## 2022-09-04 NOTE — Progress Notes (Signed)
    Teresa Bell September 21, 1985 [id number]        36 y.o.  G2P2L2   RP: Painful vulvar cyst     HPI: Left vulvar cyst noticed about 2 weeks ago.  Mild increase in size and discomfort since then.  No drainage. No fever.  No soaking done so far.  First time she has a cyst like that.   OB History  Gravida Para Term Preterm AB Living  2 2 2     2   SAB IAB Ectopic Multiple Live Births        0 2    # Outcome Date GA Lbr Len/2nd Weight Sex Delivery Anes PTL Lv  2 Term 04/27/18 [redacted]w[redacted]d 07:13 / 04:31 9 lb 2.6 oz (4.155 kg) M Vag-Vacuum EPI  LIV  1 Term 10/19/14 [redacted]w[redacted]d 04:25 / 00:18 7 lb 15.5 oz (3.615 kg) F Vag-Spont EPI  LIV     Birth Comments: None    Past medical history,surgical history, problem list, medications, allergies, family history and social history were all reviewed and documented in the EPIC chart.   Directed ROS with pertinent positives and negatives documented in the history of present illness/assessment and plan.  Exam:  Vitals:   09/04/22 1055  BP: 110/74  Pulse: 64  SpO2: 98%   General appearance:  Normal  Gynecologic exam: Left Bartholin gland cyst 2 cm.  Mildly tender.  No drainage.  No erythema.   Assessment/Plan:  37 y.o. G2P2002   1. Cyst of left Bartholin's gland Left vulvar cyst noticed about 2 weeks ago.  Mild increase in size and discomfort since then.  No drainage. No fever.  No soaking done so far.  First time she has a cyst like that. Small Left Bartholin gland cyst, possibly getting infected, measuring about 2 cm, mildly tender, no erythema, no drainage.  Counseling done on Bartholin gland cysts and abscesses.  Decision to start on Keflex, patient denies allergy to Keflex.  Previous rash with Penicillin.  Recommend warm water soaking up to 3 times a day.  If no spontaneous drainage, will f/u next week for I/D or Word Catheter or Marsupialization.  Other orders - cephALEXin (KEFLEX) 500 MG capsule; Take 1 capsule (500 mg total) by mouth 2 (two) times  daily for 7 days.   Princess Bruins MD, 11:08 AM 09/04/2022

## 2022-09-04 NOTE — Telephone Encounter (Signed)
Patient was seen for visit today and advised "If no spontaneous drainage, will f/u next week for I/D or Word Catheter or Marsupialization."  She is calling because she has questions in the event she has to have I&D and ward catheter.  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 Will she be able to work the next day?  When?  2. How long will cath be in?  3. She said Dr. Marguerita Merles told her "long catheter". She asked how that will work? Tuck it in her underwear?  4. Can she do regular schedule?  Has two children.  5. How long will she be uncomfortable?

## 2022-09-04 NOTE — Telephone Encounter (Signed)
Yes a Ward catheter can be tucked in her underwear and can work.  F/U at 3 weeks to remove the Ward catheter.  Probably mildly uncomfortable, but can work, until it is removed. Dr L    My Chart message sent informing patient.

## 2022-09-08 ENCOUNTER — Other Ambulatory Visit (HOSPITAL_COMMUNITY): Payer: Self-pay

## 2022-09-08 MED ORDER — LEVOTHYROXINE SODIUM 100 MCG PO TABS
100.0000 ug | ORAL_TABLET | Freq: Every morning | ORAL | 1 refills | Status: DC
  Filled 2022-09-08: qty 30, 30d supply, fill #0

## 2022-09-08 MED ORDER — LIOTHYRONINE SODIUM 5 MCG PO TABS
10.0000 ug | ORAL_TABLET | Freq: Every day | ORAL | 1 refills | Status: DC
  Filled 2022-09-08: qty 60, 30d supply, fill #0
  Filled 2022-10-06: qty 60, 30d supply, fill #1

## 2022-09-09 DIAGNOSIS — Z6829 Body mass index (BMI) 29.0-29.9, adult: Secondary | ICD-10-CM | POA: Diagnosis not present

## 2022-09-09 DIAGNOSIS — E282 Polycystic ovarian syndrome: Secondary | ICD-10-CM | POA: Diagnosis not present

## 2022-09-11 ENCOUNTER — Ambulatory Visit: Payer: Commercial Managed Care - PPO | Admitting: Obstetrics & Gynecology

## 2022-10-07 ENCOUNTER — Other Ambulatory Visit: Payer: Self-pay

## 2022-10-09 DIAGNOSIS — E7800 Pure hypercholesterolemia, unspecified: Secondary | ICD-10-CM | POA: Diagnosis not present

## 2022-10-09 DIAGNOSIS — Z6830 Body mass index (BMI) 30.0-30.9, adult: Secondary | ICD-10-CM | POA: Diagnosis not present

## 2022-10-28 DIAGNOSIS — N951 Menopausal and female climacteric states: Secondary | ICD-10-CM | POA: Diagnosis not present

## 2022-10-28 DIAGNOSIS — E063 Autoimmune thyroiditis: Secondary | ICD-10-CM | POA: Diagnosis not present

## 2022-10-30 ENCOUNTER — Other Ambulatory Visit (HOSPITAL_COMMUNITY): Payer: Self-pay

## 2022-10-30 DIAGNOSIS — M2550 Pain in unspecified joint: Secondary | ICD-10-CM | POA: Diagnosis not present

## 2022-10-30 DIAGNOSIS — E039 Hypothyroidism, unspecified: Secondary | ICD-10-CM | POA: Diagnosis not present

## 2022-10-30 DIAGNOSIS — R232 Flushing: Secondary | ICD-10-CM | POA: Diagnosis not present

## 2022-10-30 DIAGNOSIS — N951 Menopausal and female climacteric states: Secondary | ICD-10-CM | POA: Diagnosis not present

## 2022-10-30 DIAGNOSIS — R5382 Chronic fatigue, unspecified: Secondary | ICD-10-CM | POA: Diagnosis not present

## 2022-10-30 DIAGNOSIS — Z6830 Body mass index (BMI) 30.0-30.9, adult: Secondary | ICD-10-CM | POA: Diagnosis not present

## 2022-10-30 DIAGNOSIS — R768 Other specified abnormal immunological findings in serum: Secondary | ICD-10-CM | POA: Diagnosis not present

## 2022-10-30 MED ORDER — THYROID 120 MG PO TABS
120.0000 mg | ORAL_TABLET | Freq: Every day | ORAL | 3 refills | Status: DC
  Filled 2022-10-30: qty 30, 30d supply, fill #0
  Filled 2022-12-15: qty 30, 30d supply, fill #1
  Filled 2023-01-22: qty 30, 30d supply, fill #2
  Filled 2023-03-02: qty 30, 30d supply, fill #3

## 2022-10-30 MED ORDER — PROGESTERONE MICRONIZED 100 MG PO CAPS
100.0000 mg | ORAL_CAPSULE | Freq: Every day | ORAL | 1 refills | Status: DC
  Filled 2022-10-30: qty 90, 45d supply, fill #0

## 2022-10-31 ENCOUNTER — Other Ambulatory Visit (HOSPITAL_COMMUNITY): Payer: Self-pay

## 2023-02-10 ENCOUNTER — Other Ambulatory Visit: Payer: Self-pay | Admitting: Registered Nurse

## 2023-02-10 DIAGNOSIS — E041 Nontoxic single thyroid nodule: Secondary | ICD-10-CM

## 2023-02-10 DIAGNOSIS — E039 Hypothyroidism, unspecified: Secondary | ICD-10-CM | POA: Diagnosis not present

## 2023-02-11 DIAGNOSIS — E039 Hypothyroidism, unspecified: Secondary | ICD-10-CM | POA: Diagnosis not present

## 2023-02-12 ENCOUNTER — Ambulatory Visit
Admission: RE | Admit: 2023-02-12 | Discharge: 2023-02-12 | Disposition: A | Discharge status: Home / Self Care | Payer: Commercial Managed Care - PPO | Source: Ambulatory Visit | Attending: Registered Nurse | Admitting: Registered Nurse

## 2023-02-12 DIAGNOSIS — R221 Localized swelling, mass and lump, neck: Secondary | ICD-10-CM | POA: Diagnosis not present

## 2023-02-12 DIAGNOSIS — E041 Nontoxic single thyroid nodule: Secondary | ICD-10-CM

## 2023-02-13 ENCOUNTER — Other Ambulatory Visit (HOSPITAL_COMMUNITY): Payer: Self-pay

## 2023-02-13 MED ORDER — CEFDINIR 300 MG PO CAPS
300.0000 mg | ORAL_CAPSULE | Freq: Two times a day (BID) | ORAL | 0 refills | Status: AC
  Filled 2023-02-13: qty 14, 7d supply, fill #0

## 2023-02-25 ENCOUNTER — Other Ambulatory Visit (HOSPITAL_COMMUNITY): Payer: Self-pay

## 2023-03-03 ENCOUNTER — Ambulatory Visit: Payer: Commercial Managed Care - PPO | Admitting: Radiology

## 2023-03-12 DIAGNOSIS — Z1389 Encounter for screening for other disorder: Secondary | ICD-10-CM | POA: Diagnosis not present

## 2023-03-12 DIAGNOSIS — R7301 Impaired fasting glucose: Secondary | ICD-10-CM | POA: Diagnosis not present

## 2023-03-12 DIAGNOSIS — E039 Hypothyroidism, unspecified: Secondary | ICD-10-CM | POA: Diagnosis not present

## 2023-03-17 DIAGNOSIS — R82998 Other abnormal findings in urine: Secondary | ICD-10-CM | POA: Diagnosis not present

## 2023-03-17 DIAGNOSIS — E282 Polycystic ovarian syndrome: Secondary | ICD-10-CM | POA: Diagnosis not present

## 2023-03-17 DIAGNOSIS — Z0000 Encounter for general adult medical examination without abnormal findings: Secondary | ICD-10-CM | POA: Diagnosis not present

## 2023-03-17 DIAGNOSIS — E039 Hypothyroidism, unspecified: Secondary | ICD-10-CM | POA: Diagnosis not present

## 2023-03-17 DIAGNOSIS — G43909 Migraine, unspecified, not intractable, without status migrainosus: Secondary | ICD-10-CM | POA: Diagnosis not present

## 2023-03-17 DIAGNOSIS — R768 Other specified abnormal immunological findings in serum: Secondary | ICD-10-CM | POA: Diagnosis not present

## 2023-03-17 DIAGNOSIS — E669 Obesity, unspecified: Secondary | ICD-10-CM | POA: Diagnosis not present

## 2023-03-17 DIAGNOSIS — R7301 Impaired fasting glucose: Secondary | ICD-10-CM | POA: Diagnosis not present

## 2023-03-17 DIAGNOSIS — R221 Localized swelling, mass and lump, neck: Secondary | ICD-10-CM | POA: Diagnosis not present

## 2023-03-17 DIAGNOSIS — E8881 Metabolic syndrome: Secondary | ICD-10-CM | POA: Diagnosis not present

## 2023-03-17 DIAGNOSIS — Z1331 Encounter for screening for depression: Secondary | ICD-10-CM | POA: Diagnosis not present

## 2023-03-24 ENCOUNTER — Other Ambulatory Visit (HOSPITAL_COMMUNITY): Payer: Self-pay

## 2023-03-24 ENCOUNTER — Encounter: Payer: Self-pay | Admitting: Physician Assistant

## 2023-03-24 ENCOUNTER — Ambulatory Visit (INDEPENDENT_AMBULATORY_CARE_PROVIDER_SITE_OTHER): Payer: Commercial Managed Care - PPO | Admitting: Physician Assistant

## 2023-03-24 VITALS — BP 117/68 | HR 62 | Ht 66.0 in | Wt 184.0 lb

## 2023-03-24 DIAGNOSIS — F331 Major depressive disorder, recurrent, moderate: Secondary | ICD-10-CM

## 2023-03-24 DIAGNOSIS — F411 Generalized anxiety disorder: Secondary | ICD-10-CM | POA: Diagnosis not present

## 2023-03-24 DIAGNOSIS — F3281 Premenstrual dysphoric disorder: Secondary | ICD-10-CM

## 2023-03-24 MED ORDER — FLUOXETINE HCL 20 MG PO CAPS
20.0000 mg | ORAL_CAPSULE | Freq: Every day | ORAL | 1 refills | Status: DC
  Filled 2023-03-24: qty 30, 30d supply, fill #0
  Filled 2023-05-02: qty 30, 30d supply, fill #1

## 2023-03-24 MED ORDER — LORAZEPAM 0.5 MG PO TABS
0.2500 mg | ORAL_TABLET | Freq: Four times a day (QID) | ORAL | 1 refills | Status: DC | PRN
  Filled 2023-03-24: qty 30, 8d supply, fill #0
  Filled 2023-06-11 (×2): qty 30, 8d supply, fill #1
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
## 2023-04-09 ENCOUNTER — Ambulatory Visit (INDEPENDENT_AMBULATORY_CARE_PROVIDER_SITE_OTHER): Payer: Commercial Managed Care - PPO | Admitting: Professional Counselor

## 2023-04-09 ENCOUNTER — Encounter: Payer: Self-pay | Admitting: Professional Counselor

## 2023-04-09 DIAGNOSIS — F331 Major depressive disorder, recurrent, moderate: Secondary | ICD-10-CM | POA: Diagnosis not present

## 2023-04-09 DIAGNOSIS — F411 Generalized anxiety disorder: Secondary | ICD-10-CM
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
## 2023-04-13 ENCOUNTER — Other Ambulatory Visit (HOSPITAL_COMMUNITY): Payer: Self-pay

## 2023-04-13 MED ORDER — THYROID 120 MG PO TABS
120.0000 mg | ORAL_TABLET | Freq: Every day | ORAL | 3 refills | Status: AC
  Filled 2023-04-13: qty 90, 90d supply, fill #0
  Filled 2023-07-21: qty 90, 90d supply, fill #1
  Filled 2023-10-27: qty 90, 90d supply, fill #2
  Filled 2024-01-11 (×2): qty 90, 90d supply, fill #3
  Filled 2024-01-12: qty 90, 90d supply, fill #0

## 2023-04-15 ENCOUNTER — Encounter: Payer: Self-pay | Admitting: Radiology

## 2023-04-15 ENCOUNTER — Ambulatory Visit (INDEPENDENT_AMBULATORY_CARE_PROVIDER_SITE_OTHER): Payer: Commercial Managed Care - PPO | Admitting: Radiology

## 2023-04-15 VITALS — BP 116/82 | Ht 65.0 in | Wt 187.0 lb

## 2023-04-15 DIAGNOSIS — Z01419 Encounter for gynecological examination (general) (routine) without abnormal findings: Secondary | ICD-10-CM | POA: Diagnosis not present

## 2023-04-15 NOTE — Progress Notes (Signed)
Teresa Bell 38-Jul-1987 [id number]   History:  37 y.o. G2P2 presents for annual exam. Periods are still irregular and heavy at times. Tried POP last year, saw no improvement. Recently started prozac to see if it will help with anxiety and PMDD. Denies any other gyn problems. Thyroid managed by Dr Clelia Croft.   Gynecologic History Patient's last menstrual period was 04/08/2023 (exact date). Period Cycle (Days):  (26-30) Period Duration (Days): 5 Period Pattern: (!) Irregular Menstrual Flow: Moderate, Heavy Menstrual Control: Maxi pad, Thin pad Dysmenorrhea: (!) Moderate (moderate to severe) Dysmenorrhea Symptoms: Cramping Contraception/Family planning: vasectomy Sexually active: yes Last Pap: 2022. Results were: normal   Obstetric History OB History  Gravida Para Term Preterm AB Living  2 2 2     2   SAB IAB Ectopic Multiple Live Births        0 2    # Outcome Date GA Lbr Len/2nd Weight Sex Type Anes PTL Lv  2 Term 04/27/18 [redacted]w[redacted]d 07:13 / 04:31 9 lb 2.6 oz (4.155 kg) M Vag-Vacuum EPI  LIV  1 Term 10/19/14 [redacted]w[redacted]d 04:25 / 00:18 7 lb 15.5 oz (3.615 kg) F Vag-Spont EPI  LIV     Birth Comments: None    The following portions of the patient's history were reviewed and updated as appropriate: allergies, current medications, past family history, past medical history, past social history, past surgical history, and problem list.  Review of Systems  All other systems reviewed and are negative.   Past medical history, past surgical history, family history and social history were all reviewed and documented in the EPIC chart.  Exam:  Vitals:   04/15/23 1549  BP: 116/82  Weight: 187 lb (84.8 kg)  Height: 5\' 5"  (1.651 m)   Body mass index is 31.12 kg/m.  Physical Exam Vitals and nursing note reviewed. Exam conducted with a chaperone present.  Constitutional:      Appearance: Normal appearance. She is normal weight.  HENT:     Head: Normocephalic and atraumatic.  Neck:     Thyroid:  No thyroid mass, thyromegaly or thyroid tenderness.  Cardiovascular:     Rate and Rhythm: Regular rhythm.     Heart sounds: Normal heart sounds.  Pulmonary:     Effort: Pulmonary effort is normal.     Breath sounds: Normal breath sounds.  Chest:  Breasts:    Breasts are symmetrical.     Right: Normal. No inverted nipple, mass, nipple discharge, skin change or tenderness.     Left: Normal. No inverted nipple, mass, nipple discharge, skin change or tenderness.  Abdominal:     General: Abdomen is flat. Bowel sounds are normal.     Palpations: Abdomen is soft.  Genitourinary:    General: Normal vulva.     Vagina: Normal. No vaginal discharge, bleeding or lesions.     Cervix: Normal. No discharge or lesion.     Uterus: Normal. Not enlarged and not tender.      Adnexa: Right adnexa normal and left adnexa normal.       Right: No mass, tenderness or fullness.         Left: No mass, tenderness or fullness.    Lymphadenopathy:     Upper Body:     Right upper body: No axillary adenopathy.     Left upper body: No axillary adenopathy.  Skin:    General: Skin is warm and dry.  Neurological:     Mental Status: She is alert and oriented to  person, place, and time.  Psychiatric:        Mood and Affect: Mood normal.        Thought Content: Thought content normal.        Judgment: Judgment normal.     Raynelle Fanning, CMA present for exam  Assessment/Plan:   1. Well woman exam with routine gynecological exam Pap 2025 Discussed options for menorrhagia including IUD or Slynd (POP no help last year). Will consider and let us know if she would like to try one of those options.    Discussed SBE, pap and mammogram (at 40) screening as directed/appropriate. Recommend of exercise weekly, including weight bearing exercise. Encouraged the use of sunscreen.  Return in about 1 year (around 04/14/2024) for Annual.  Arlie Solomons B WHNP-BC 3:56 PM 04/15/2023

## 2023-05-05 ENCOUNTER — Encounter: Payer: Self-pay | Admitting: Physician Assistant

## 2023-05-05 ENCOUNTER — Telehealth: Payer: Commercial Managed Care - PPO | Admitting: Physician Assistant

## 2023-05-05 ENCOUNTER — Other Ambulatory Visit (HOSPITAL_COMMUNITY): Payer: Self-pay

## 2023-05-05 DIAGNOSIS — F3341 Major depressive disorder, recurrent, in partial remission: Secondary | ICD-10-CM | POA: Diagnosis not present

## 2023-05-05 DIAGNOSIS — F411 Generalized anxiety disorder: Secondary | ICD-10-CM

## 2023-05-05 DIAGNOSIS — F3281 Premenstrual dysphoric disorder: Secondary | ICD-10-CM

## 2023-05-05 MED ORDER — FLUOXETINE HCL 20 MG PO CAPS
20.0000 mg | ORAL_CAPSULE | Freq: Every day | ORAL | 0 refills | Status: DC
  Filled 2023-05-05 – 2023-06-03 (×2): qty 90, 90d supply, fill #0
  Filled 2023-09-15: qty 30, 30d supply, fill #1

## 2023-05-05 NOTE — Progress Notes (Signed)
[facility] Med Check  Patient ID: Teresa Bell,  MRN: [id number]  PCP: Cleatis Polka., MD  Date of Evaluation: 05/05/2023 Time spent:20 minutes  Chief Complaint:  Chief Complaint   Anxiety; Follow-up; Depression    Virtual Visit via Telehealth  I connected with patient by a video enabled telemedicine application with their informed consent, and verified patient privacy and that I am speaking with the correct person using two identifiers.  I am private, in my office and the patient is private in her car.  I discussed the limitations, risks, security and privacy concerns of performing an evaluation and management service by video and the availability of in person appointments. I also discussed with the patient that there may be a patient responsible charge related to this service. The patient expressed understanding and agreed to proceed.   I discussed the assessment and treatment plan with the patient. The patient was provided an opportunity to ask questions and all were answered. The patient agreed with the plan and demonstrated an understanding of the instructions.   The patient was advised to call back or seek an in-person evaluation if the symptoms worsen or if the condition fails to improve as anticipated.  I provided 20 minutes of non-face-to-face time during this encounter.  HISTORY/CURRENT STATUS: HPI for 6-week med check  At her initial visit 6 weeks ago we started Prozac and Ativan.  She also began counseling with Adalberto Ill, [facility].    States she is a 75% better with the depression and anxiety.  She feels like the current medications have really been helpful.  She has only taken the Ativan 3-4 times and it has been beneficial.  She does not like to take medications and tries to use other techniques that she is learning in counseling as well as exercise and diet to help her mental health.  No panic attacks.  She does still get overwhelmed at times but not nearly as  often or as severe as before.  Patient is able to enjoy things.  Energy and motivation are good.  Work is going well.   No extreme sadness, tearfulness, or feelings of hopelessness.  Sleeps well most of the time. ADLs and personal hygiene are normal.   Denies any changes in concentration, making decisions, or remembering things.  Appetite has not changed.  Weight is stable.  Denies suicidal or homicidal thoughts.  Denies dizziness, syncope, seizures, numbness, tingling, tremor, tics, unsteady gait, slurred speech, confusion. Denies muscle or joint pain, stiffness, or dystonia.  Individual Medical History/ Review of Systems: Changes? :No   Past medications for mental health diagnoses include: Prozac, Ativan  Allergies: Amoxicillin  Current Medications:  Current Outpatient Medications:    famotidine (PEPCID) 20 MG tablet, Take 20 mg by mouth as needed., Disp: , Rfl:    ibuprofen (ADVIL,MOTRIN) 600 MG tablet, Take 1 tablet (600 mg total) by mouth every 6 (six) hours., Disp: 30 tablet, Rfl: 0   LORazepam (ATIVAN) 0.5 MG tablet, Take 1/2-1 tablet (0.25-0.5mg ) by mouth every 6 (six) hours as needed for anxiety., Disp: 30 tablet, Rfl: 1   thyroid (NP THYROID) 120 MG tablet, Take 1 tablet (120 mg total) by mouth daily on an empty stomach, Disp: 90 tablet, Rfl: 3   FLUoxetine (PROZAC) 20 MG capsule, Take 1 capsule (20 mg total) by mouth daily., Disp: 90 capsule, Rfl: 0 Medication Side Effects: none  Family Medical/ Social History: Changes? No  MENTAL HEALTH EXAM:  Last menstrual period 04/08/2023.There is no  height or weight on file to calculate BMI.  General Appearance: Casual and Well Groomed  Eye Contact:  Good  Speech:  Clear and Coherent and Normal Rate  Volume:  Normal  Mood:  Euthymic  Affect:  Congruent  Thought Process:  Goal Directed and Descriptions of Associations: Circumstantial  Orientation:  Full (Time, Place, and Person)  Thought Content: Logical   Suicidal Thoughts:  No   Homicidal Thoughts:  No  Memory:  WNL  Judgement:  Good  Insight:  Good  Psychomotor Activity:  Normal  Concentration:  Concentration: Good  Recall:  Good  Fund of Knowledge: Good  Language: Good  Assets:  Desire for Improvement Financial Resources/Insurance Housing Transportation Vocational/Educational  ADL's:  Intact  Cognition: WNL  Prognosis:  Good   DIAGNOSES:    ICD-10-CM   1. Recurrent major depressive disorder, in partial remission (HCC)  F33.41     2. Generalized anxiety disorder  F41.1     3. PMDD (premenstrual dysphoric disorder)  F32.81       Receiving Psychotherapy: Yes with Adalberto Ill, [facility]  RECOMMENDATIONS:  PDMP reviewed.  Ativan filled 03/24/2023. I provided 20 minutes of non-face-to-face time during this encounter, including time spent before and after the visit in records review, medical decision making, counseling pertinent to today's visit, and charting.   I am glad to see her doing so much better!  No change in treatment necessary.  We briefly discussed the PMDD.  She saw her GYN since our last visit and they also talked about it.  She is considering having an IUD or another hormonal treatment but Swayze is not ready to do that now.  We have discussed adding Zoloft premenstrually.  If she decides to do that at any point between now and her next visit she can call and we can start that.  Continue Prozac 20 mg daily. Continue Ativan 0.5 mg, 1/2-1 p.o. 4 times daily as needed anxiety. Continue therapy with Adalberto Ill, [facility]. Return in 3 months.  Melony Overly, PA-C

## 2023-06-04 ENCOUNTER — Ambulatory Visit: Payer: Commercial Managed Care - PPO | Admitting: Professional Counselor

## 2023-06-04 ENCOUNTER — Encounter: Payer: Self-pay | Admitting: Professional Counselor

## 2023-06-04 ENCOUNTER — Other Ambulatory Visit (HOSPITAL_COMMUNITY): Payer: Self-pay

## 2023-06-04 DIAGNOSIS — F330 Major depressive disorder, recurrent, mild: Secondary | ICD-10-CM | POA: Diagnosis not present

## 2023-06-04 DIAGNOSIS — F411 Generalized anxiety disorder: Secondary | ICD-10-CM | POA: Diagnosis not present

## 2023-06-04 NOTE — Progress Notes (Signed)
      [facility] Counselor/Therapist Progress Note  Patient ID: ISRA LINDY, MRN: [id number],    Date: 06/04/2023  Time Spent: 2:14 PM to 3:12 PM  Treatment Type: Individual Therapy  Reported Symptoms: Worries, low mood, stress, restlessness, caregiver strain, interpersonal concerns  Mental Status Exam:  Appearance:   Neat     Behavior:  Appropriate, Sharing, and Motivated  Motor:  Normal  Speech/Language:   Clear and Coherent and Normal Rate  Affect:  Appropriate and Congruent  Mood:  normal  Thought process:  normal  Thought content:    WNL  Sensory/Perceptual disturbances:    WNL  Orientation:  oriented to person, place, time/date, and situation  Attention:  Good  Concentration:  Good  Memory:  WNL  Fund of knowledge:   Good  Insight:    Good  Judgment:   Good  Impulse Control:  Good   Risk Assessment: Danger to Self:  No Self-injurious Behavior: No Danger to Others: No Duty to Warn:no Physical Aggression / Violence:No  Access to Firearms a concern: No  Gang Involvement:No   Subjective: Patient presented to session to address concerns of depression and anxiety.  She reported progress.  She voiced meds as helpful.  Counselor and patient discussed patient treatment plan and patient gave her consent.  Patient identified having read about parentification and voiced experience of parentification as resonating for her.  She voiced working to heal and improve by implementing boundaries with her parent as an adult, and resourcing support of her husband to take care of herself and her family as a priority.  Patient shared with counselor regarding times she had set boundaries since last session, and response, and how she coped.  She processed experience of difficulty in doing so, however her perseverance.  Counselor affirmed patient self advocacy and proactive efforts.  Patient expressed sense of guilt even though intellectually she believes she is in the right for placing  boundaries, and counselor helped facilitate insight and encourage patient to trust herself and her understanding of her and her family's needs.  Counselor offered psychoeducation regarding healthy, porous and rigid boundaries, and counselor and patient discussed.  Patient identified New Year's resolutions of working on her sugar intake, and not allowing negativity to dictate how she feels.  Counselor and patient discussed CBT interventions related to strengthening positive self talk and reframing of negative thinking tendencies such as guilt that drives overfunctioning for others.  Interventions: Assertiveness/Communication, Solution-Oriented/Positive Psychology, Humanistic/Existential, and Insight-Oriented, CBT  Diagnosis:   ICD-10-CM   1. Major depressive disorder, recurrent episode, mild (HCC)  F33.0     2. Generalized anxiety disorder  F41.1       Plan: Patient is scheduled for follow-up; continue process work and developing coping skills.  Patient to reflect on boundary resources, and continue to implement CBT reframing, thought stopping and interrupting as discussed in session to encourage positive self talk and to limit sense of guilt.  Progress note was dictated with Dragon and reviewed for accuracy.  Almarie ONEIDA Sprang, [facility]

## 2023-06-05 ENCOUNTER — Other Ambulatory Visit (HOSPITAL_COMMUNITY): Payer: Self-pay

## 2023-06-10 ENCOUNTER — Other Ambulatory Visit (HOSPITAL_COMMUNITY): Payer: Self-pay

## 2023-06-10 ENCOUNTER — Other Ambulatory Visit: Payer: Self-pay | Admitting: Family

## 2023-06-10 MED ORDER — PREDNISONE 10 MG PO TABS
10.0000 mg | ORAL_TABLET | Freq: Every day | ORAL | 0 refills | Status: DC
  Filled 2023-06-10: qty 60, 60d supply, fill #0

## 2023-06-10 MED ORDER — METHOCARBAMOL 500 MG PO TABS
500.0000 mg | ORAL_TABLET | Freq: Four times a day (QID) | ORAL | 0 refills | Status: DC | PRN
  Filled 2023-06-10: qty 30, 8d supply, fill #0

## 2023-06-11 ENCOUNTER — Other Ambulatory Visit (HOSPITAL_COMMUNITY): Payer: Self-pay

## 2023-06-11 ENCOUNTER — Other Ambulatory Visit: Payer: Self-pay

## 2023-07-02 ENCOUNTER — Ambulatory Visit: Payer: Commercial Managed Care - PPO | Admitting: Professional Counselor

## 2023-07-30 ENCOUNTER — Ambulatory Visit: Payer: Commercial Managed Care - PPO | Admitting: Professional Counselor

## 2023-07-30 ENCOUNTER — Encounter: Payer: Self-pay | Admitting: Professional Counselor

## 2023-07-30 DIAGNOSIS — F331 Major depressive disorder, recurrent, moderate: Secondary | ICD-10-CM

## 2023-07-30 DIAGNOSIS — F411 Generalized anxiety disorder: Secondary | ICD-10-CM

## 2023-07-30 NOTE — Progress Notes (Signed)
      [facility] Counselor/Therapist Progress Note  Patient ID: Teresa Bell, MRN: [id number],    Date: 07/30/2023  Time Spent: 2:08 PM to 3:13 PM  Treatment Type: Individual Therapy  Reported Symptoms: Tearfulness, worries, restlessness, fatigue, stress, self-esteem concerns, interpersonal concerns  Mental Status Exam:  Appearance:   Neat     Behavior:  Appropriate, Sharing, and Motivated  Motor:  Normal  Speech/Language:   Clear and Coherent and Normal Rate  Affect:  Tearful  Mood:  sad  Thought process:  normal  Thought content:    WNL  Sensory/Perceptual disturbances:    WNL  Orientation:  oriented to person, place, time/date, and situation  Attention:  Good  Concentration:  Good  Memory:  WNL  Fund of knowledge:   Good  Insight:    Good  Judgment:   Good  Impulse Control:  Good   Risk Assessment: Danger to Self:  No Self-injurious Behavior: No Danger to Others: No Duty to Warn:no Physical Aggression / Violence:No  Access to Firearms a concern: No  Gang Involvement:No   Subjective: Patient presented to session to address concerns of depression and anxiety.  She reported mixed progress.  Patient processed experience of a recent life circumstance since last session that exacerbated her sense of not being enough.  She reflected on the origin of this internalized message.  Counselor and patient discussed strategies to limit negative self talk and encourage cognitive restructuring; counselor helped patient develop a personal framework of statements to practice.  Counselor and patient discussed patient's strong inner voice that she has been mindful of utilizing to implement and maintain boundaries, and continuing to resource this strength.  Counselor reinforced patient limiting exposure to inordinate relational stressors by way of boundaries including choices not to engage.  Interventions: Cognitive Behavioral Therapy, Assertiveness/Communication, Solution-Oriented/Positive  Psychology, Humanistic/Existential, and Insight-Oriented  Diagnosis:   ICD-10-CM   1. Major depressive disorder, recurrent episode, moderate (HCC)  F33.1     2. Generalized anxiety disorder  F41.1       Plan: Patient is scheduled for follow-up; continue process work and developing coping skills.  Patient personal goal between sessions to continue to work on positive self talk and cognitive restructuring, and implementation of boundaries where needed.  Progress note was dictated with Dragon and reviewed for accuracy.  Gaspar Bidding, [facility]

## 2023-08-05 ENCOUNTER — Other Ambulatory Visit: Payer: Self-pay

## 2023-08-05 ENCOUNTER — Other Ambulatory Visit: Payer: Self-pay | Admitting: Radiology

## 2023-08-05 DIAGNOSIS — M542 Cervicalgia: Secondary | ICD-10-CM

## 2023-08-25 ENCOUNTER — Telehealth: Payer: Self-pay | Admitting: Professional Counselor

## 2023-08-25 ENCOUNTER — Ambulatory Visit: Payer: Commercial Managed Care - PPO | Admitting: Professional Counselor

## 2023-08-25 DIAGNOSIS — Z0389 Encounter for observation for other suspected diseases and conditions ruled out: Secondary | ICD-10-CM

## 2023-08-27 ENCOUNTER — Other Ambulatory Visit: Payer: Self-pay

## 2023-08-27 ENCOUNTER — Encounter (HOSPITAL_COMMUNITY): Payer: Self-pay | Admitting: *Deleted

## 2023-08-27 ENCOUNTER — Emergency Department (HOSPITAL_COMMUNITY): Admission: EM | Admit: 2023-08-27 | Discharge: 2023-08-27 | Disposition: A | Discharge status: Home / Self Care

## 2023-08-27 DIAGNOSIS — R55 Syncope and collapse: Secondary | ICD-10-CM | POA: Insufficient documentation

## 2023-08-27 DIAGNOSIS — M791 Myalgia, unspecified site: Secondary | ICD-10-CM | POA: Diagnosis not present

## 2023-08-27 DIAGNOSIS — R42 Dizziness and giddiness: Secondary | ICD-10-CM | POA: Insufficient documentation

## 2023-08-27 LAB — CBC
HCT: 39.8 % (ref 36.0–46.0)
Hemoglobin: 13.2 g/dL (ref 12.0–15.0)
MCH: 30.8 pg (ref 26.0–34.0)
MCHC: 33.2 g/dL (ref 30.0–36.0)
MCV: 93 fL (ref 80.0–100.0)
Platelets: 162 10*3/uL (ref 150–400)
RBC: 4.28 MIL/uL (ref 3.87–5.11)
RDW: 11.8 % (ref 11.5–15.5)
WBC: 5.6 10*3/uL (ref 4.0–10.5)
nRBC: 0 % (ref 0.0–0.2)

## 2023-08-27 LAB — RESP PANEL BY RT-PCR (RSV, FLU A&B, COVID)  RVPGX2
Influenza A by PCR: NEGATIVE
Influenza B by PCR: NEGATIVE
Resp Syncytial Virus by PCR: NEGATIVE
SARS Coronavirus 2 by RT PCR: NEGATIVE

## 2023-08-27 LAB — BASIC METABOLIC PANEL WITH GFR
Anion gap: 10 (ref 5–15)
BUN: 12 mg/dL (ref 6–20)
CO2: 22 mmol/L (ref 22–32)
Calcium: 8 mg/dL — ABNORMAL LOW (ref 8.9–10.3)
Chloride: 107 mmol/L (ref 98–111)
Creatinine, Ser: 0.69 mg/dL (ref 0.44–1.00)
GFR, Estimated: >60 mL/min (ref >60)
Glucose, Bld: 102 mg/dL — ABNORMAL HIGH (ref 70–99)
Potassium: 4.1 mmol/L (ref 3.5–5.1)
Sodium: 139 mmol/L (ref 135–145)

## 2023-08-27 LAB — HCG, SERUM, QUALITATIVE: Preg, Serum: NEGATIVE

## 2023-08-27 MED ORDER — LACTATED RINGERS IV BOLUS
1000.0000 mL | Freq: Once | INTRAVENOUS | Status: AC
  Administered 2023-08-27: 1000 mL via INTRAVENOUS

## 2023-08-27 NOTE — ED Triage Notes (Addendum)
 Pt brought in by [facility] from home with c/o syncopal episode while sitting on the toilet. Initial BP upon EMS arrival 86/40. Pt reports she has been having diarrhea, fatigue, and nasal congestion for the past several days.

## 2023-08-27 NOTE — ED Provider Notes (Signed)
  EMERGENCY DEPARTMENT AT [facility] Provider Note   CSN: [id number] Arrival date & time: 08/27/23  4098     History  Chief Complaint  Patient presents with   Loss of Consciousness    Teresa Bell is a 38 y.o. female.  38 year old female with past medical history of Hashimoto's disease and GERD presenting to the emergency department today after a syncopal episode.  The patient states that over the past few days she has been having a lot of URI symptoms.  The patient states she started off with some cough and congestion.  She reports that this has improved but she has been having loose stools over the past few days.  She reports having multiple loose stools per day.  She denies any blood in her stool or dark stools.  Denies any associated abdominal pain with this.  She was having some myalgias a few days ago as well.  She denies a history of DVT or pulmonary embolism, recent surgeries, recent travel.  She denies any chest pain.  She came to the emergency department today after she had a syncopal episode.  The patient states she had a loose stool on the toilet and became lightheaded.  She apparently briefly lost consciousness.  She denies any falls or injuries from this.  She states that this happened a few years ago when she was very dehydrated.  She is not on any oral contraceptives.   Loss of Consciousness      Home Medications Prior to Admission medications   Medication Sig Start Date End Date Taking? Authorizing Provider  famotidine (PEPCID) 20 MG tablet Take 20 mg by mouth as needed.   Yes [provider]  FLUoxetine (PROZAC) 20 MG capsule Take 1 capsule (20 mg total) by mouth daily. 05/05/23  Yes Hurst, Glade Nurse, PA-C  ibuprofen (ADVIL,MOTRIN) 600 MG tablet Take 1 tablet (600 mg total) by mouth every 6 (six) hours. Patient taking differently: Take 800 mg by mouth every 6 (six) hours. 04/28/18  Yes Sigmon, Scarlette Slice, CNM  LORazepam (ATIVAN) 0.5 MG  tablet Take 1/2-1 tablet (0.25-0.5mg ) by mouth every 6 (six) hours as needed for anxiety. 03/24/23  Yes Hurst, Glade Nurse, PA-C  methocarbamol (ROBAXIN) 500 MG tablet Take 1 tablet (500 mg total) by mouth every 6 (six) hours as needed for muscle spasms. 06/10/23  Yes Adonis Huguenin, NP  thyroid (NP THYROID) 120 MG tablet Take 1 tablet (120 mg total) by mouth daily on an empty stomach 04/13/23  Yes       Allergies    Amoxicillin    Review of Systems   Review of Systems  Cardiovascular:  Positive for syncope.  Gastrointestinal:  Positive for diarrhea.  Neurological:  Positive for syncope.  All other systems reviewed and are negative.   Physical Exam Updated Vital Signs BP 102/61   Pulse (!) 56   Temp 97.9 F (36.6 C) (Oral)   Resp 15   Ht 5\' 6"  (1.676 m)   Wt 84.4 kg   LMP 08/22/2023   SpO2 98%   BMI 30.02 kg/m  Physical Exam Vitals and nursing note reviewed.   Gen: NAD Eyes: PERRL, EOMI HEENT: no oropharyngeal swelling Neck: trachea midline Resp: clear to auscultation bilaterally Card: RRR, no murmurs, rubs, or gallops Abd: nontender, nondistended Extremities: no calf tenderness, no edema Vascular: 2+ radial pulses bilaterally, 2+ DP pulses bilaterally Neuro: No focal deficits Skin: no rashes Psyc: acting appropriately   ED Results /  Procedures / Treatments   Labs (all labs ordered are listed, but only abnormal results are displayed) Labs Reviewed  BASIC METABOLIC PANEL WITH GFR - Abnormal; Notable for the following components:      Result Value   Glucose, Bld 102 (*)    Calcium 8.0 (*)    All other components within normal limits  RESP PANEL BY RT-PCR (RSV, FLU A&B, COVID)  RVPGX2  CBC  HCG, SERUM, QUALITATIVE    EKG EKG Interpretation Date/Time:  Thursday August 27 2023 09:21:41 EDT Ventricular Rate:  56 PR Interval:  153 QRS Duration:  109 QT Interval:  460 QTC Calculation: 444 R Axis:   78  Text Interpretation: Sinus rhythm Low voltage, precordial  leads Confirmed by Beckey Downing [phone]) on 08/27/2023 9:26:55 AM  Radiology No results found.  Procedures Procedures    Medications Ordered in ED Medications  lactated ringers bolus 1,000 mL (0 mLs Intravenous Stopped 08/27/23 1115)  lactated ringers bolus 1,000 mL (1,000 mLs Intravenous New Bag/Given 08/27/23 1116)    ED Course/ Medical Decision Making/ A&P                                 Medical Decision Making 38 year old female with past medical history of Hashimoto's and GERD presenting to the emergency department today after a syncopal episode.  I will further evaluate patient here with basic labs to evaluate for anemia or electrolyte abnormalities.  Her EKG interpreted by me shows a sinus rhythm with normal axis, normal intervals, no significant ST-T changes.  Will give patient IV fluids here.  Will keep the patient on the monitor.  Will obtain a pregnancy test about for ectopic pregnancy although her abdominal exam is very reassuring and she is denying any symptoms consistent with this.  I suspect this very well may be due to dehydration.  The syncopal episode while having a bowel movement is very consistent with vasovagal syncope.  If her workup is reassuring think she may be able to be discharged.  The patient's labs here are reassuring.  After 2 L of fluid she is amatory here with no lightheadedness.  She is feeling better.  I think she stable for discharge.  Amount and/or Complexity of Data Reviewed Labs: ordered.           Final Clinical Impression(s) / ED Diagnoses Final diagnoses:  Syncope, unspecified syncope type    Rx / DC Orders ED Discharge Orders     None         Durwin Glaze, MD 08/27/23 1212

## 2023-08-27 NOTE — Progress Notes (Signed)
 Patient ID: Teresa Bell, female   DOB: 09-01-1985, 38 y.o.   MRN: [id number]  Patient phone call from 2:11 PM - 2:18 PM. Patient had requested phone call session because sick today but upon call counselor and patient determined best to not have session under circumstances of health concern. Patient gave brief check-in regarding spring activities and family time, and voiced progress in feeling less stressed regarding interpersonal concerns lately, and working to maintain boundaries and limit self blame. She identified feeling increasingly supported in her relationship with her father. Counselor and patient discussed patient next appointment in April for a follow-up.   Bartolo Darter, [facility]

## 2023-08-27 NOTE — Discharge Instructions (Addendum)
 Your workup today was reassuring.  Please drink plenty of fluids and follow-up with your doctor.  Return to the ER for worsening symptoms.

## 2023-09-14 ENCOUNTER — Other Ambulatory Visit: Payer: Self-pay | Admitting: Physician Assistant

## 2023-09-15 ENCOUNTER — Other Ambulatory Visit: Payer: Self-pay | Admitting: Physician Assistant

## 2023-09-15 ENCOUNTER — Other Ambulatory Visit (HOSPITAL_COMMUNITY): Payer: Self-pay

## 2023-09-22 ENCOUNTER — Encounter: Payer: Self-pay | Admitting: Professional Counselor

## 2023-09-22 ENCOUNTER — Ambulatory Visit: Payer: Commercial Managed Care - PPO | Admitting: Professional Counselor

## 2023-09-22 DIAGNOSIS — F411 Generalized anxiety disorder: Secondary | ICD-10-CM | POA: Diagnosis not present

## 2023-09-22 DIAGNOSIS — F330 Major depressive disorder, recurrent, mild: Secondary | ICD-10-CM | POA: Diagnosis not present

## 2023-09-22 NOTE — Progress Notes (Unsigned)
      [facility] Counselor/Therapist Progress Note  Patient ID: Teresa Bell, MRN: [id number],    Date: 09/22/2023  Time Spent: ***   Treatment Type: Individual Therapy  Reported Symptoms: ***  Mental Status Exam:  Appearance:   Neat     Behavior:  Appropriate, Sharing, and Motivated  Motor:  Normal  Speech/Language:   Clear and Coherent and Normal Rate  Affect:  Appropriate and Congruent  Mood:  normal  Thought process:  normal  Thought content:    WNL  Sensory/Perceptual disturbances:    WNL  Orientation:  oriented to person, place, time/date, and situation  Attention:  Good  Concentration:  Good  Memory:  WNL  Fund of knowledge:   Good  Insight:    Good  Judgment:   Good  Impulse Control:  Good   Risk Assessment: Danger to Self:  No Self-injurious Behavior: No Danger to Others: No Duty to Warn:no Physical Aggression / Violence:No  Access to Firearms a concern: No  Gang Involvement:No   Subjective: ***   Interventions: Assertiveness/Communication, Solution-Oriented/Positive Psychology, Humanistic/Existential, and Insight-Oriented  Diagnosis:No diagnosis found.  Plan: ***  Anthon Kins, [facility]

## 2023-10-12 ENCOUNTER — Other Ambulatory Visit: Payer: Self-pay | Admitting: Physician Assistant

## 2023-10-13 MED ORDER — FLUOXETINE HCL 20 MG PO CAPS
20.0000 mg | ORAL_CAPSULE | Freq: Every day | ORAL | 0 refills | Status: DC
  Filled 2023-10-13: qty 90, 90d supply, fill #0

## 2023-10-14 ENCOUNTER — Other Ambulatory Visit (HOSPITAL_COMMUNITY): Payer: Self-pay

## 2023-10-23 ENCOUNTER — Telehealth: Admitting: Physician Assistant

## 2023-11-03 ENCOUNTER — Ambulatory Visit: Payer: Commercial Managed Care - PPO | Admitting: Professional Counselor

## 2023-12-08 ENCOUNTER — Ambulatory Visit: Payer: Commercial Managed Care - PPO | Admitting: Professional Counselor

## 2023-12-11 ENCOUNTER — Encounter: Payer: Self-pay | Admitting: Physician Assistant

## 2023-12-11 ENCOUNTER — Telehealth: Admitting: Physician Assistant

## 2023-12-11 ENCOUNTER — Other Ambulatory Visit (HOSPITAL_COMMUNITY): Payer: Self-pay

## 2023-12-11 DIAGNOSIS — F3341 Major depressive disorder, recurrent, in partial remission: Secondary | ICD-10-CM

## 2023-12-11 DIAGNOSIS — F411 Generalized anxiety disorder: Secondary | ICD-10-CM | POA: Diagnosis not present

## 2023-12-11 MED ORDER — FLUOXETINE HCL 20 MG PO CAPS
20.0000 mg | ORAL_CAPSULE | Freq: Every day | ORAL | 1 refills | Status: AC
  Filled 2023-12-11 – 2024-01-12 (×4): qty 90, 90d supply, fill #0

## 2023-12-11 MED ORDER — LORAZEPAM 0.5 MG PO TABS
0.2500 mg | ORAL_TABLET | Freq: Four times a day (QID) | ORAL | 1 refills | Status: AC | PRN
  Filled 2023-12-11 – 2024-01-12 (×4): qty 30, 8d supply, fill #0

## 2023-12-11 NOTE — Progress Notes (Signed)
 [facility]  Patient ID: PHIL CORTI,  MRN: [id number]  PCP: Loreli Elsie JONETTA Mickey., MD  Date of Evaluation: 12/11/2023 Time spent:20 minutes  Chief Complaint:  Chief Complaint   Anxiety; Depression; Follow-up    Virtual Visit via Telehealth  I connected with patient by a video enabled telemedicine application with their informed consent, and verified patient privacy and that I am speaking with the correct person using two identifiers.  I am private, in my office and the patient is in her office at work.  I discussed the limitations, risks, security and privacy concerns of performing an evaluation and management service by video and the availability of in person appointments. I also discussed with the patient that there may be a patient responsible charge related to this service. The patient expressed understanding and agreed to proceed.   I discussed the assessment and treatment plan with the patient. The patient was provided an opportunity to ask questions and all were answered. The patient agreed with the plan and demonstrated an understanding of the instructions.   The patient was advised to call back or seek an in-person evaluation if the symptoms worsen or if the condition fails to improve as anticipated.  I provided 20 minutes of non-face-to-face time during this encounter.  HISTORY/CURRENT STATUS: HPI for routine 35-month med check.  Teresa Bell is doing really well.  She feels that the Prozac  is just enough, not causing any side effects and helps even out her mood.  She is able to enjoy things.  Energy and motivation are good.  Work is going well.  No complaints concerning appetite, ADLs, or sleep.  She does need the Ativan  occasionally, it is rare when she takes it though.  It is still effective.  No mania, psychosis, delirium, SI/HI.  Denies dizziness, syncope, seizures, numbness, tingling, tremor, tics, unsteady gait, slurred speech, confusion. Denies muscle or joint  pain, stiffness, or dystonia.  Individual Medical History/ Review of Systems: Changes? :No   Past medications for mental health diagnoses include: Prozac , Ativan   Allergies: Amoxicillin  Current Medications:  Current Outpatient Medications:    famotidine  (PEPCID ) 20 MG tablet, Take 20 mg by mouth as needed., Disp: , Rfl:    ibuprofen  (ADVIL ,MOTRIN ) 600 MG tablet, Take 1 tablet (600 mg total) by mouth every 6 (six) hours., Disp: 30 tablet, Rfl: 0   methocarbamol  (ROBAXIN ) 500 MG tablet, Take 1 tablet (500 mg total) by mouth every 6 (six) hours as needed for muscle spasms., Disp: 30 tablet, Rfl: 0   thyroid  (NP THYROID ) 120 MG tablet, Take 1 tablet (120 mg total) by mouth daily on an empty stomach, Disp: 90 tablet, Rfl: 3   FLUoxetine  (PROZAC ) 20 MG capsule, Take 1 capsule (20 mg total) by mouth daily., Disp: 90 capsule, Rfl: 1   LORazepam  (ATIVAN ) 0.5 MG tablet, Take 1/2-1 tablet (0.25-0.5mg ) by mouth every 6 (six) hours as needed for anxiety., Disp: 30 tablet, Rfl: 1 Medication Side Effects: none  Family Medical/ Social History: Changes? No  MENTAL HEALTH EXAM:  There were no vitals taken for this visit.There is no height or weight on file to calculate BMI.  General Appearance: Casual and Well Groomed  Eye Contact:  Good  Speech:  Clear and Coherent and Normal Rate  Volume:  Normal  Mood:  Euthymic  Affect:  Congruent  Thought Process:  Goal Directed and Descriptions of Associations: Circumstantial  Orientation:  Full (Time, Place, and Person)  Thought Content: Logical   Suicidal Thoughts:  No  Homicidal Thoughts:  No  Memory:  WNL  Judgement:  Good  Insight:  Good  Psychomotor Activity:  Normal  Concentration:  Concentration: Good and Attention Span: Good  Recall:  Good  Fund of Knowledge: Good  Language: Good  Assets:  Desire for Improvement Financial Resources/Insurance Housing Transportation Vocational/Educational  ADL's:  Intact  Cognition: WNL  Prognosis:  Good    DIAGNOSES:    ICD-10-CM   1. Recurrent major depressive disorder, in partial remission (HCC)  F33.41     2. Generalized anxiety disorder  F41.1       Receiving Psychotherapy: Yes with Teresa Bell, [facility]  RECOMMENDATIONS:  PDMP reviewed.  Ativan  filled 06/11/2023. I provided approximately 20 minutes of non-face-to-face time during this encounter, including time spent before and after the visit in records review, medical decision making, counseling pertinent to today's visit, and charting.   I am glad to see her doing so well!  No changes are needed.  Continue Prozac  20 mg daily. Continue Ativan  0.5 mg, 1/2-1 p.o. 4 times daily as needed anxiety. Continue therapy with Teresa Bell, [facility]. Return in 6 months.  Verneita Cooks, PA-C

## 2023-12-22 ENCOUNTER — Other Ambulatory Visit (HOSPITAL_COMMUNITY): Payer: Self-pay

## 2023-12-29 ENCOUNTER — Other Ambulatory Visit (HOSPITAL_COMMUNITY): Payer: Self-pay

## 2024-01-07 ENCOUNTER — Other Ambulatory Visit (HOSPITAL_COMMUNITY): Payer: Self-pay

## 2024-01-11 ENCOUNTER — Other Ambulatory Visit: Payer: Self-pay | Admitting: Family

## 2024-01-11 ENCOUNTER — Other Ambulatory Visit: Payer: Self-pay

## 2024-01-11 ENCOUNTER — Other Ambulatory Visit (HOSPITAL_BASED_OUTPATIENT_CLINIC_OR_DEPARTMENT_OTHER): Payer: Self-pay

## 2024-01-12 ENCOUNTER — Other Ambulatory Visit (HOSPITAL_BASED_OUTPATIENT_CLINIC_OR_DEPARTMENT_OTHER): Payer: Self-pay

## 2024-01-12 MED ORDER — METHOCARBAMOL 500 MG PO TABS
500.0000 mg | ORAL_TABLET | Freq: Four times a day (QID) | ORAL | 0 refills | Status: AC | PRN
  Filled 2024-01-12: qty 30, 8d supply, fill #0

## 2024-02-11 ENCOUNTER — Other Ambulatory Visit: Payer: Self-pay | Admitting: Sports Medicine

## 2024-02-11 DIAGNOSIS — E063 Autoimmune thyroiditis: Secondary | ICD-10-CM

## 2024-02-12 ENCOUNTER — Other Ambulatory Visit: Payer: Self-pay

## 2024-02-12 DIAGNOSIS — E063 Autoimmune thyroiditis: Secondary | ICD-10-CM

## 2024-02-14 ENCOUNTER — Other Ambulatory Visit: Payer: Self-pay | Admitting: Sports Medicine

## 2024-02-15 LAB — TEST AUTHORIZATION

## 2024-02-15 LAB — THYROID PANEL WITH TSH
Free Thyroxine Index: 1.7 (ref 1.4–3.8)
T3 Uptake: 33 % (ref 22–35)
T4, Total: 5.2 ug/dL (ref 5.1–11.9)
TSH: 0.12 m[IU]/L — ABNORMAL LOW

## 2024-02-15 LAB — T4, FREE: Free T4: 1.1 ng/dL (ref 0.8–1.8)

## 2024-02-15 LAB — T3, FREE: T3, Free: 4.7 pg/mL — ABNORMAL HIGH (ref 2.3–4.2)

## 2024-02-17 ENCOUNTER — Ambulatory Visit (INDEPENDENT_AMBULATORY_CARE_PROVIDER_SITE_OTHER): Admitting: Sports Medicine

## 2024-02-17 VITALS — BP 117/79 | HR 56

## 2024-02-17 DIAGNOSIS — R635 Abnormal weight gain: Secondary | ICD-10-CM | POA: Diagnosis not present

## 2024-02-17 DIAGNOSIS — F439 Reaction to severe stress, unspecified: Secondary | ICD-10-CM

## 2024-02-17 DIAGNOSIS — E063 Autoimmune thyroiditis: Secondary | ICD-10-CM

## 2024-02-17 NOTE — Progress Notes (Unsigned)
 Teresa Bell Seen - 38 y.o. female MRN [id number]  Date of birth: Mar 13, 1986  Office Visit Note: Visit Date: 02/17/2024 PCP: Teresa Elsie JONETTA Mickey., MD Referred by: Teresa Elsie JONETTA Mickey., MD  Subjective: No chief complaint on file.  HPI: Teresa Bell is a pleasant 38 y.o. female who presents today for evaluation of weight gain in setting of hashimoto's thyroiditis.  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  Shatyra feels like her thyroid /metabolically she has been off for the past few months. She had lost 20 lbs or more from 2023-2024 per her last MD visit with Dr. Loreli on 03/17/23, however she reports she has gained about 10 pounds over the last few months.  She has done this while sticking to a very strict 1200-calorie per day restriction.  Given her Hashimoto's, she also has PCOS and gut permeability issues and has removed gluten from her diet, sweets and follows a high-protein low carbohydrate diet.  She does report recent brain fog and issues with concentration and has been experiencing some hair loss which she states she has dealt with during her time with Hashimoto's.  She also battles with migraine headaches these have been worse as well recently, they have tried previous prophylactic medications although she would rather avoid medication therapy as possible.  She does report her sleep has been very dysregulated and has not been sleeping well nor good quality.  In terms of her weight/PCOS, she has tried metformin  in the past but did not tolerate the GI side effects.  Her and Dr. Maree have discussed GLP-1's, but again she would like to avoid medication if possible.  In terms of activity, she does walk 2 to 3 miles about 6 days/week.    She is managed on NP thyroid  120 mg daily.  She has always been between 100-120 mg, dose has not changed in a few years.  Recent thyroid  levels (02/12/24): Lab Results  Component Value Date   TSH 0.12 (L) 02/12/2024   - Free T3: 4.7 - Free T4:  1.1  Pertinent ROS were reviewed with the patient and found to be negative unless otherwise specified above in HPI.   Assessment & Plan: Visit Diagnoses:  1. Hashimoto's thyroiditis   2. Weight gain   3. Stress    Plan: Impression is recent weight gain, brain fog and metabolic dysregulation in the setting of hashimoto's thyroiditis with recent labs actually indicative of mild hyperthyroidism.  She does eat very well and has stuck to a very strict 1200 calorie per day regimen and is walking 2-3 miles 6 days/week but still experiencing weight gain.  I do think it is more metabolic dysregulation in the setting of stress-induced hypercortisolism as Teresa Bell does admit she has been under quite a bit of stress at work covering for multiple people being out and poor sleep quality.  We also discussed the benefits of getting steps in/walking but how for true weight loss she would need to incorporate higher intensity physical activity and anaerobic training. Recommended moderate to high intensity workouts at least 3-4x weekly while still continuing her walking 2-3x per week in addition. I also provided recommendation for supplements given her hashimoto's and previous Vit D deficiency to help support this/thyroid  support, and energy/sleep, see above in AVS. I certainly would not go up on her NP thyroid  status her TSH is low (if anything would reduce back down to 100mg  daily), but would like her to make the above lifestyle changes  and would recommend following back up with Dr. Loreli in the next 6-8 weeks with repeat thyroid  levels.   Meds & Orders: No orders of the defined types were placed in this encounter.  No orders of the defined types were placed in this encounter.    Procedures: No procedures performed      Clinical History: No specialty comments available.  She reports that she has never smoked. She has never been exposed to tobacco smoke. She has never used smokeless tobacco. No results for input(s):  HGBA1C, LABURIC in the last 8760 hours.  Objective:   Vital Signs: BP 117/79 (BP Location: Left Arm, Patient Position: Sitting)   Pulse (!) 56   - Weight: 195 lbs - Height: 5' 6.5 - BMI: 31.0  Physical Exam  Gen: Well-appearing, in no acute distress; non-toxic CV: Well-perfused. Warm.  Resp: Breathing unlabored on room air; no wheezing. Psych: Fluid speech in conversation; appropriate affect; normal thought process  Imaging: No results found.  Past Medical/Family/Surgical/Social History: Medications & Allergies reviewed per EMR, new medications updated. Patient Active Problem List   Diagnosis Date Noted   Hashimoto's thyroiditis 08/26/2019   PCOS (polycystic ovarian syndrome) 08/26/2019   Vacuum-assisted vaginal delivery 04/28/2018   Second-degree perineal laceration, with delivery 04/28/2018   Acute blood loss anemia 04/28/2018   Obstetrical laceration: right sulcus 04/28/2018   Encounter for elective induction of labor 04/27/2018   Postpartum care following VAVD (11/26) 10/20/2014   Hypothyroidism affecting pregnancy in third trimester    Cervical incompetence affecting management of pregnancy in second trimester, antepartum 07/24/2014   Past Medical History:  Diagnosis Date   Anxiety    Family history of adverse reaction to anesthesia    mother-- ponv   GERD (gastroesophageal reflux disease)    Hashimoto's disease    Headache    Hypothyroidism    Incompetent cervix    PMDD (premenstrual dysphoric disorder)    Polycystic ovary syndrome    Pregnancy    Family History  Problem Relation Age of Onset   Heart disease Mother    Diabetes Mother    Thyroid  disease Mother        Hypothyroid   Hyperlipidemia Mother    Hypertension Father    Cancer Father    Skin cancer Father    Kidney Stones Father    Drug abuse Brother    Alcohol  abuse Brother    Lung cancer Maternal Uncle    Cancer Maternal Uncle    Diabetes Maternal Grandfather    Heart disease  Maternal Grandfather    Cancer Maternal Grandfather        lymphoma   Cancer Maternal Grandmother        bladder   Hypertension Paternal Grandfather    Heart disease Paternal Grandfather    Kidney Stones Paternal Grandmother    Bone cancer Other    Cancer Other    Healthy Son    Healthy Daughter    Past Surgical History:  Procedure Laterality Date   CERVICAL CERCLAGE N/A 10/23/2017   Procedure: CERCLAGE CERVICAL;  Surgeon: Rutherford Gain, MD;  Location: [facility] [place];  Service: Gynecology;  Laterality: N/A;  EDD: 04/27/18   ORIF RIGHT ANKLE FRACTURE  2005   retained hardware   Social History   Occupational History   Not on file  Tobacco Use   Smoking status: Never    Passive exposure: Never   Smokeless tobacco: Never  Vaping Use   Vaping status: Never Used  Substance  and Sexual Activity   Alcohol  use: No   Drug use: No   Sexual activity: Yes    Partners: Male    Birth control/protection: Other-see comments    Comment: 1ST intercourse- 23, partners- , husband vasectomy

## 2024-02-18 ENCOUNTER — Encounter: Payer: Self-pay | Admitting: Sports Medicine

## 2024-02-18 NOTE — Patient Instructions (Addendum)
 Annell,  Things to work on:  1.) Workouts/Activity  - 3-4 x per week mod-high intensity workouts (peloton/stationary bike, weight training, jogging/treadmill) --> goal to get your HR at or above 70% of max heart rate  - continuing walking 2-3 miles / week on the other days; giving yourself 1 day off per week  2.) Prioritize sleep --> goal of 7+ hours nightly  3.) Stress Reduction  4.) Supplements   - Vitamin D3 800 IU daily, also best to get Vit D from sun - Magnesium  Glycinate --> start with 200mg  in evening x 2 weeks, if tolerating -->  Can then increase to 400mg  daily (in evening) - Selenium --> this is in a daily multivitamin, but easy to absorb through food, I.e. [place] nuts

## 2024-02-19 ENCOUNTER — Other Ambulatory Visit: Payer: Self-pay

## 2024-02-19 ENCOUNTER — Telehealth: Payer: Self-pay

## 2024-02-19 DIAGNOSIS — F439 Reaction to severe stress, unspecified: Secondary | ICD-10-CM

## 2024-02-19 DIAGNOSIS — E063 Autoimmune thyroiditis: Secondary | ICD-10-CM

## 2024-02-19 DIAGNOSIS — R635 Abnormal weight gain: Secondary | ICD-10-CM

## 2024-02-19 NOTE — Telephone Encounter (Signed)
 Prolactin drawn on patient

## 2024-02-20 LAB — PROLACTIN: Prolactin: 16.7 ng/mL

## 2024-02-20 NOTE — Telephone Encounter (Signed)
 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  I briefly evaluated her after clinic completed 530 last week.  Patient has a history of Hashimoto's and autoimmune disease.  She has been well-managed on [employer]our Thyroid  for over a year but has developed symptoms over the past 4 weeks including insomnia brain fog and some degree of weight gain despite a 1200-calorie diet daily for well over 4 weeks.  In addition she has had 2 nights of galactorrhea and is not pregnant by history.  She is not taking any new medications.  She did have lab work drawn with elevated T3 and TSH of 0.12.  These laboratory values are not consistent with hypothyroidism although some of her clinical symptoms do suggest hypothyroidism.  She also has contradicting clinical symptoms associated with hyperthyroidism.  The galactorrhea is the most concerning new symptom over the past month along with feelings of change in her overall disposition healthwise.  Differential diagnosis for this change as well as the new onset of galactorrhea would be new medications, clinical hypothyroidism which is not really supported by her laboratory values, or possible pituitary adenoma.  Would like to draw a prolactin level today which if significantly elevated over 100 would require further imaging of her head.  This is all done as a preamble her upcoming appointment sometime in the near future with Dr. Kirt who is a endocrinologist in town.  I think it would be helpful to have all the data or as much of the data as possible prior to her clinic visit with him.  Particularly in light of the somewhat atypical development of galactorrhea for 2 consecutive nights and this patient.

## 2024-02-22 NOTE — Telephone Encounter (Signed)
 no

## 2024-03-15 ENCOUNTER — Ambulatory Visit: Admitting: Radiology

## 2024-03-15 ENCOUNTER — Telehealth: Payer: Self-pay | Admitting: *Deleted

## 2024-03-15 ENCOUNTER — Encounter: Payer: Self-pay | Admitting: Radiology

## 2024-03-15 VITALS — BP 116/78 | HR 62 | Temp 98.8°F | Resp 99 | Wt 200.0 lb

## 2024-03-15 DIAGNOSIS — N920 Excessive and frequent menstruation with regular cycle: Secondary | ICD-10-CM | POA: Diagnosis not present

## 2024-03-15 DIAGNOSIS — E282 Polycystic ovarian syndrome: Secondary | ICD-10-CM | POA: Diagnosis not present

## 2024-03-15 DIAGNOSIS — E063 Autoimmune thyroiditis: Secondary | ICD-10-CM | POA: Diagnosis not present

## 2024-03-15 DIAGNOSIS — N6452 Nipple discharge: Secondary | ICD-10-CM | POA: Diagnosis not present

## 2024-03-15 LAB — PREGNANCY, URINE: Preg Test, Ur: NEGATIVE

## 2024-03-15 NOTE — Telephone Encounter (Signed)
 Spoke with patient, request to schedule on 10/17 or a Tuesday.   Spoke with Tatiyana at [facility], was advised will need to be scheduled as Bilateral Dx MMG and right breast US . Scheduled for 10/21 at 1310.  Patient notified, agreeable to date and time.   Encounter closed.

## 2024-03-15 NOTE — Progress Notes (Signed)
   Teresa Bell [birth year] [id number]   History:  38 y.o. G2P2 presents with c/o nipple d/c x 4 weeks. Was a large amount of milky d/c from right breast at first, now just a small amount. Thyroid  has been difficult to control. Thyroid  managed by Dr Loreli.  Would like a referral to Dr Faythe. Periods are still irregular and heavy at times. Painful and heavy pressure. Hx of PCOS. Did not tolerate metformin . Tried POP last year, saw no improvement.    Gynecologic History Patient's last menstrual period was 02/27/2024 (exact date).   Contraception/Family planning: vasectomy Sexually active: yes Last Pap: 2022. Results were: normal   Obstetric History OB History  Gravida Para Term Preterm AB Living  2 2 2   2   SAB IAB Ectopic Multiple Live Births     0 2    # Outcome Date GA Lbr Len/2nd Weight Sex Type Anes PTL Lv  2 Term 04/27/18 [redacted]w[redacted]d 07:13 / 04:31 9 lb 2.6 oz (4.155 kg) M Vag-Vacuum EPI  LIV  1 Term 10/19/14 [redacted]w[redacted]d 04:25 / 00:18 7 lb 15.5 oz (3.615 kg) F Vag-Spont EPI  LIV     Birth Comments: None    The following portions of the patient's history were reviewed and updated as appropriate: allergies, current medications, past family history, past medical history, past social history, past surgical history, and problem list.  Review of Systems  All other systems reviewed and are negative.   Past medical history, past surgical history, family history and social history were all reviewed and documented in the EPIC chart.  Exam:  Vitals:   03/15/24 0935  BP: 116/78  Pulse: 62  Resp: (!) 99  Temp: 98.8 F (37.1 C)  TempSrc: Oral  Weight: 200 lb (90.7 kg)   Body mass index is 32.28 kg/m.  Physical Exam Constitutional:      Appearance: Normal appearance. She is overweight.  Pulmonary:     Effort: Pulmonary effort is normal.  Chest:  Breasts:    Right: Normal.     Left: Normal.  Neurological:     Mental Status: She is alert and oriented to person, place, and time.   Psychiatric:        Mood and Affect: Mood normal.        Thought Content: Thought content normal.        Judgment: Judgment normal.    Teresa Bell, CMA present for exam  Assessment/Plan:   1. Nipple discharge in female (Primary) - Pregnancy, urine; negative - Prolactin - Thyroid  Panel With TSH - Dx mammo right breast  2. PCOS (polycystic ovarian syndrome)  3. Menorrhagia with regular cycle Discussed IUD vs surgical management, will consider   4. Hashimoto's thyroiditis - Ambulatory referral to Endocrinology    Teresa Bell WHNP-BC 10:05 AM 03/15/2024

## 2024-03-15 NOTE — Telephone Encounter (Signed)
-----   Message from CHRZANOWSKI, [place] B sent at 03/15/2024 10:09 AM EDT ----- Regarding: dx mammo Please schedule dx mammo of right breast for nipple d/c with normal labs

## 2024-03-16 ENCOUNTER — Ambulatory Visit: Payer: Self-pay | Admitting: Radiology

## 2024-03-16 LAB — THYROID PANEL WITH TSH
Free Thyroxine Index: 2.1 (ref 1.4–3.8)
T3 Uptake: 32 % (ref 22–35)
T4, Total: 6.5 ug/dL (ref 5.1–11.9)
TSH: 0.05 m[IU]/L — ABNORMAL LOW

## 2024-03-16 LAB — PROLACTIN: Prolactin: 18 ng/mL

## 2024-03-22 ENCOUNTER — Ambulatory Visit
Admission: RE | Admit: 2024-03-22 | Discharge: 2024-03-22 | Disposition: A | Source: Ambulatory Visit | Attending: Radiology | Admitting: Radiology

## 2024-03-22 ENCOUNTER — Ambulatory Visit
Admission: RE | Admit: 2024-03-22 | Discharge: 2024-03-22 | Disposition: A | Discharge status: Home / Self Care | Source: Ambulatory Visit | Attending: Radiology | Admitting: Radiology

## 2024-03-22 DIAGNOSIS — N6452 Nipple discharge: Secondary | ICD-10-CM

## 2024-03-22 DIAGNOSIS — R928 Other abnormal and inconclusive findings on diagnostic imaging of breast: Secondary | ICD-10-CM | POA: Diagnosis not present

## 2024-04-04 ENCOUNTER — Encounter: Payer: Self-pay | Admitting: Radiology

## 2024-04-12 DIAGNOSIS — R7301 Impaired fasting glucose: Secondary | ICD-10-CM | POA: Diagnosis not present

## 2024-04-12 DIAGNOSIS — Z0000 Encounter for general adult medical examination without abnormal findings: Secondary | ICD-10-CM | POA: Diagnosis not present

## 2024-04-12 DIAGNOSIS — E039 Hypothyroidism, unspecified: Secondary | ICD-10-CM | POA: Diagnosis not present

## 2024-04-19 DIAGNOSIS — E282 Polycystic ovarian syndrome: Secondary | ICD-10-CM | POA: Diagnosis not present

## 2024-04-19 DIAGNOSIS — N644 Mastodynia: Secondary | ICD-10-CM | POA: Diagnosis not present

## 2024-04-19 DIAGNOSIS — E669 Obesity, unspecified: Secondary | ICD-10-CM | POA: Diagnosis not present

## 2024-04-19 DIAGNOSIS — G43909 Migraine, unspecified, not intractable, without status migrainosus: Secondary | ICD-10-CM | POA: Diagnosis not present

## 2024-04-19 DIAGNOSIS — Z0000 Encounter for general adult medical examination without abnormal findings: Secondary | ICD-10-CM | POA: Diagnosis not present

## 2024-04-19 DIAGNOSIS — Z1331 Encounter for screening for depression: Secondary | ICD-10-CM | POA: Diagnosis not present

## 2024-04-19 DIAGNOSIS — R7689 Other specified abnormal immunological findings in serum: Secondary | ICD-10-CM | POA: Diagnosis not present

## 2024-04-19 DIAGNOSIS — R635 Abnormal weight gain: Secondary | ICD-10-CM | POA: Diagnosis not present

## 2024-04-19 DIAGNOSIS — L680 Hirsutism: Secondary | ICD-10-CM | POA: Diagnosis not present

## 2024-04-19 DIAGNOSIS — R7301 Impaired fasting glucose: Secondary | ICD-10-CM | POA: Diagnosis not present

## 2024-04-19 DIAGNOSIS — O926 Galactorrhea: Secondary | ICD-10-CM | POA: Diagnosis not present

## 2024-04-19 DIAGNOSIS — E039 Hypothyroidism, unspecified: Secondary | ICD-10-CM | POA: Diagnosis not present

## 2024-04-19 DIAGNOSIS — Z1339 Encounter for screening examination for other mental health and behavioral disorders: Secondary | ICD-10-CM | POA: Diagnosis not present

## 2024-04-19 DIAGNOSIS — N643 Galactorrhea not associated with childbirth: Secondary | ICD-10-CM | POA: Diagnosis not present

## 2024-04-19 DIAGNOSIS — E063 Autoimmune thyroiditis: Secondary | ICD-10-CM | POA: Diagnosis not present

## 2024-04-21 DIAGNOSIS — R635 Abnormal weight gain: Secondary | ICD-10-CM | POA: Diagnosis not present

## 2024-04-21 DIAGNOSIS — L680 Hirsutism: Secondary | ICD-10-CM | POA: Diagnosis not present

## 2024-04-22 ENCOUNTER — Other Ambulatory Visit (HOSPITAL_COMMUNITY): Payer: Self-pay

## 2024-04-22 MED ORDER — SYNTHROID 112 MCG PO TABS
112.0000 ug | ORAL_TABLET | Freq: Every morning | ORAL | 4 refills | Status: AC
  Filled 2024-04-22: qty 90, 90d supply, fill #0

## 2024-05-03 DIAGNOSIS — L680 Hirsutism: Secondary | ICD-10-CM | POA: Diagnosis not present

## 2024-05-03 DIAGNOSIS — E063 Autoimmune thyroiditis: Secondary | ICD-10-CM | POA: Diagnosis not present

## 2024-05-03 DIAGNOSIS — N644 Mastodynia: Secondary | ICD-10-CM | POA: Diagnosis not present

## 2024-05-03 DIAGNOSIS — E039 Hypothyroidism, unspecified: Secondary | ICD-10-CM | POA: Diagnosis not present

## 2024-05-03 DIAGNOSIS — E669 Obesity, unspecified: Secondary | ICD-10-CM | POA: Diagnosis not present

## 2024-05-03 DIAGNOSIS — E282 Polycystic ovarian syndrome: Secondary | ICD-10-CM | POA: Diagnosis not present

## 2024-05-03 DIAGNOSIS — O926 Galactorrhea: Secondary | ICD-10-CM | POA: Diagnosis not present

## 2024-05-05 ENCOUNTER — Other Ambulatory Visit (HOSPITAL_BASED_OUTPATIENT_CLINIC_OR_DEPARTMENT_OTHER): Payer: Self-pay

## 2024-05-30 NOTE — Telephone Encounter (Signed)
 Counselor spoke to pt by phone and pt sick; joint decision made to not have appointment.  Almarie Sprang, [facility]

## 2024-06-13 ENCOUNTER — Telehealth: Admitting: Physician Assistant
